# Patient Record
Sex: Female | Born: 1985 | Race: Black or African American | Hispanic: No | Marital: Single | Smoking: Former smoker
Health system: Southern US, Community
[De-identification: ages and names within clinical notes are randomized; demographics above are authoritative.]

## PROBLEM LIST (undated history)

## (undated) ENCOUNTER — Inpatient Hospital Stay (HOSPITAL_COMMUNITY): Payer: Self-pay

## (undated) DIAGNOSIS — L309 Dermatitis, unspecified: Secondary | ICD-10-CM

## (undated) DIAGNOSIS — G43909 Migraine, unspecified, not intractable, without status migrainosus: Secondary | ICD-10-CM

## (undated) HISTORY — PX: NO PAST SURGERIES: SHX2092

## (undated) HISTORY — DX: Dermatitis, unspecified: L30.9

---
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
## 2007-01-05 ENCOUNTER — Ambulatory Visit (HOSPITAL_COMMUNITY): Admission: RE | Admit: 2007-01-05 | Discharge: 2007-01-05 | Payer: Self-pay | Admitting: Obstetrics & Gynecology

## 2007-01-19 DIAGNOSIS — G43909 Migraine, unspecified, not intractable, without status migrainosus: Secondary | ICD-10-CM | POA: Insufficient documentation

## 2007-01-19 DIAGNOSIS — L2089 Other atopic dermatitis: Secondary | ICD-10-CM | POA: Insufficient documentation

## 2007-05-25 ENCOUNTER — Encounter (INDEPENDENT_AMBULATORY_CARE_PROVIDER_SITE_OTHER): Payer: Self-pay | Admitting: Obstetrics and Gynecology

## 2007-05-25 ENCOUNTER — Inpatient Hospital Stay (HOSPITAL_COMMUNITY): Admission: AD | Admit: 2007-05-25 | Discharge: 2007-05-27 | Payer: Self-pay | Admitting: Obstetrics and Gynecology

## 2007-10-15 ENCOUNTER — Emergency Department (HOSPITAL_COMMUNITY): Admission: EM | Admit: 2007-10-15 | Discharge: 2007-10-15 | Payer: Self-pay | Admitting: Family Medicine

## 2008-01-14 ENCOUNTER — Emergency Department (HOSPITAL_COMMUNITY): Admission: EM | Admit: 2008-01-14 | Discharge: 2008-01-14 | Payer: Self-pay | Admitting: Family Medicine

## 2008-08-06 ENCOUNTER — Emergency Department (HOSPITAL_COMMUNITY): Admission: EM | Admit: 2008-08-06 | Discharge: 2008-08-07 | Payer: Self-pay | Admitting: Emergency Medicine

## 2009-02-17 ENCOUNTER — Emergency Department (HOSPITAL_COMMUNITY): Admission: EM | Admit: 2009-02-17 | Discharge: 2009-02-17 | Payer: Self-pay | Admitting: Emergency Medicine

## 2009-06-10 ENCOUNTER — Emergency Department (HOSPITAL_COMMUNITY): Admission: EM | Admit: 2009-06-10 | Discharge: 2009-06-10 | Payer: Self-pay | Admitting: Emergency Medicine

## 2010-01-09 ENCOUNTER — Inpatient Hospital Stay (HOSPITAL_COMMUNITY): Admission: AD | Admit: 2010-01-09 | Discharge: 2010-01-11 | Payer: Self-pay | Admitting: Obstetrics and Gynecology

## 2010-04-18 IMAGING — CR DG CHEST 2V
2 series · 2 of 2 positions shown · non-contrast
Comparison: None

CLINICAL DATA: Fever and cough

CHEST - 2 VIEW

[w chest pa]
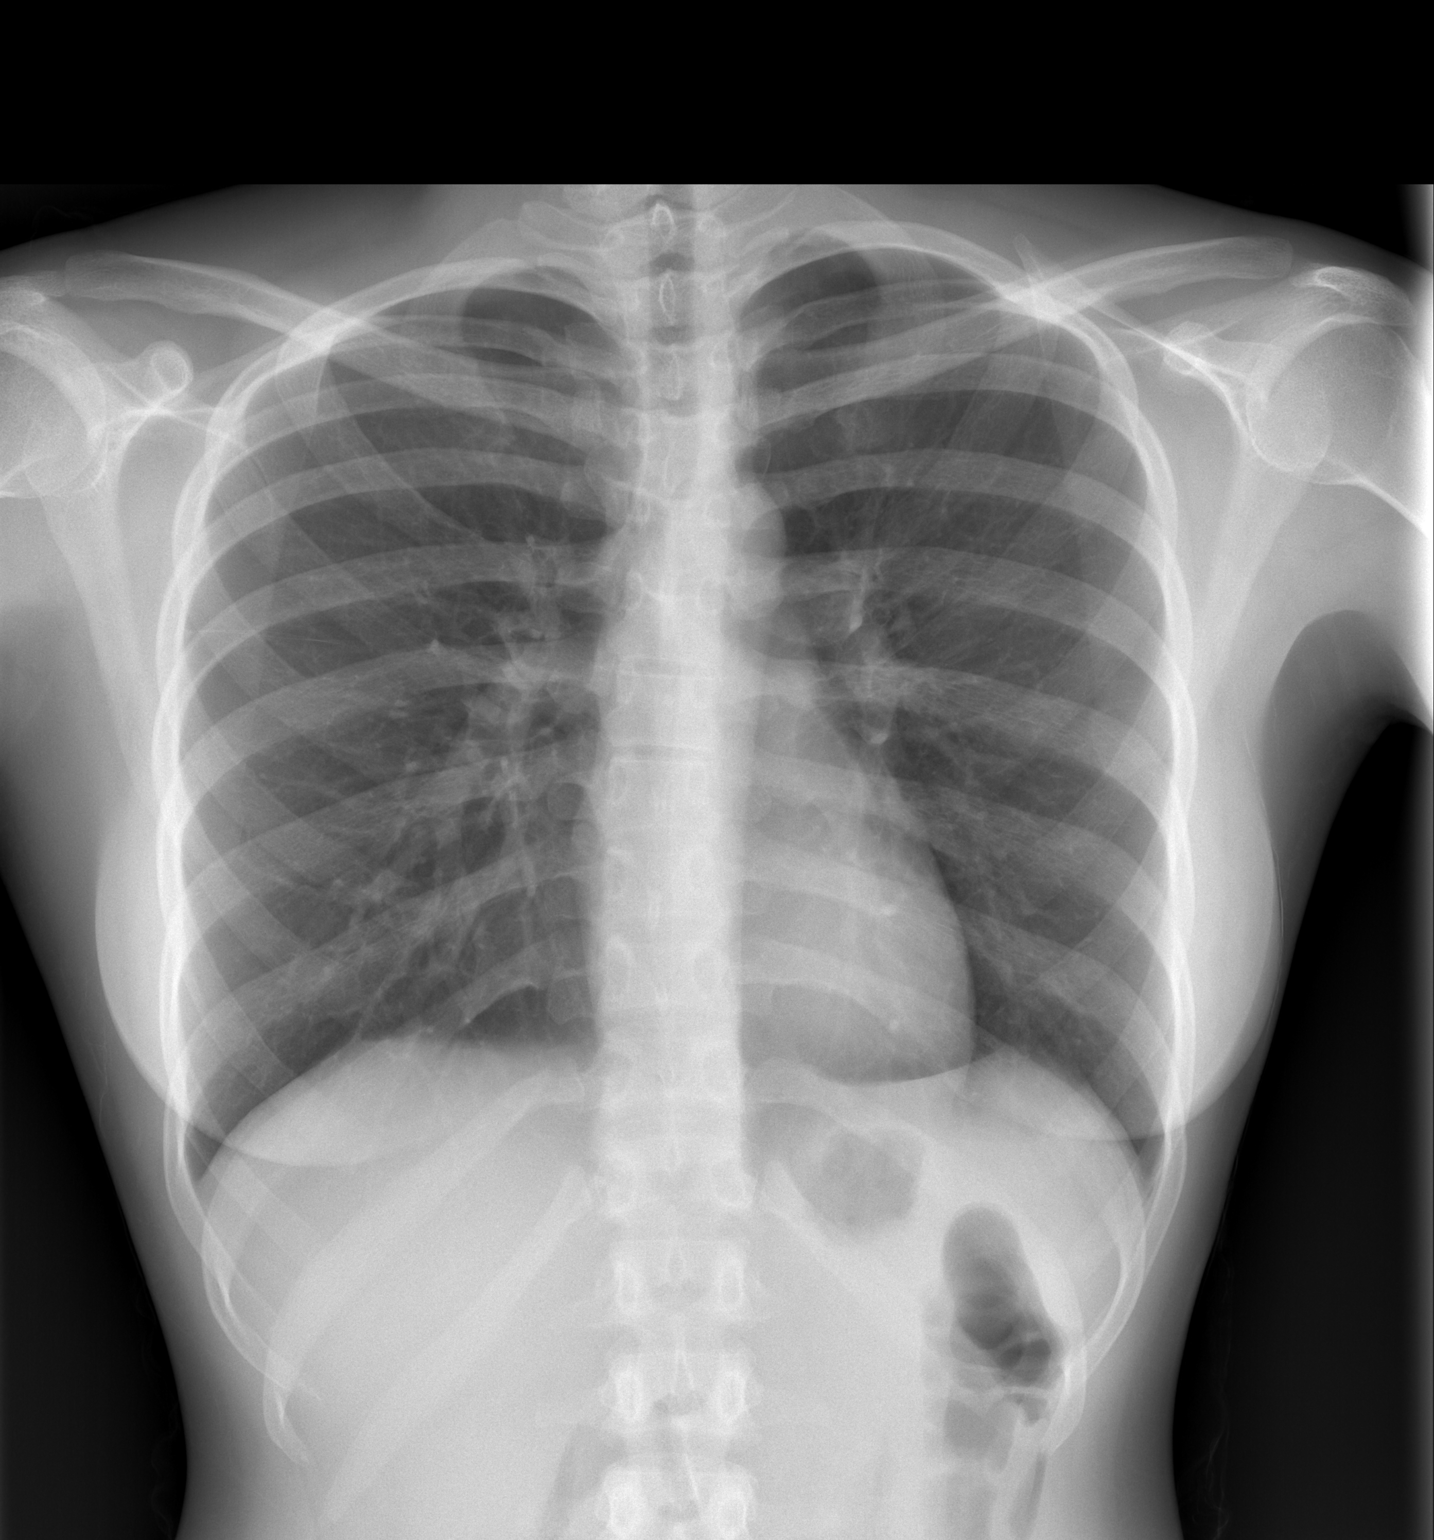

[w chest lat]
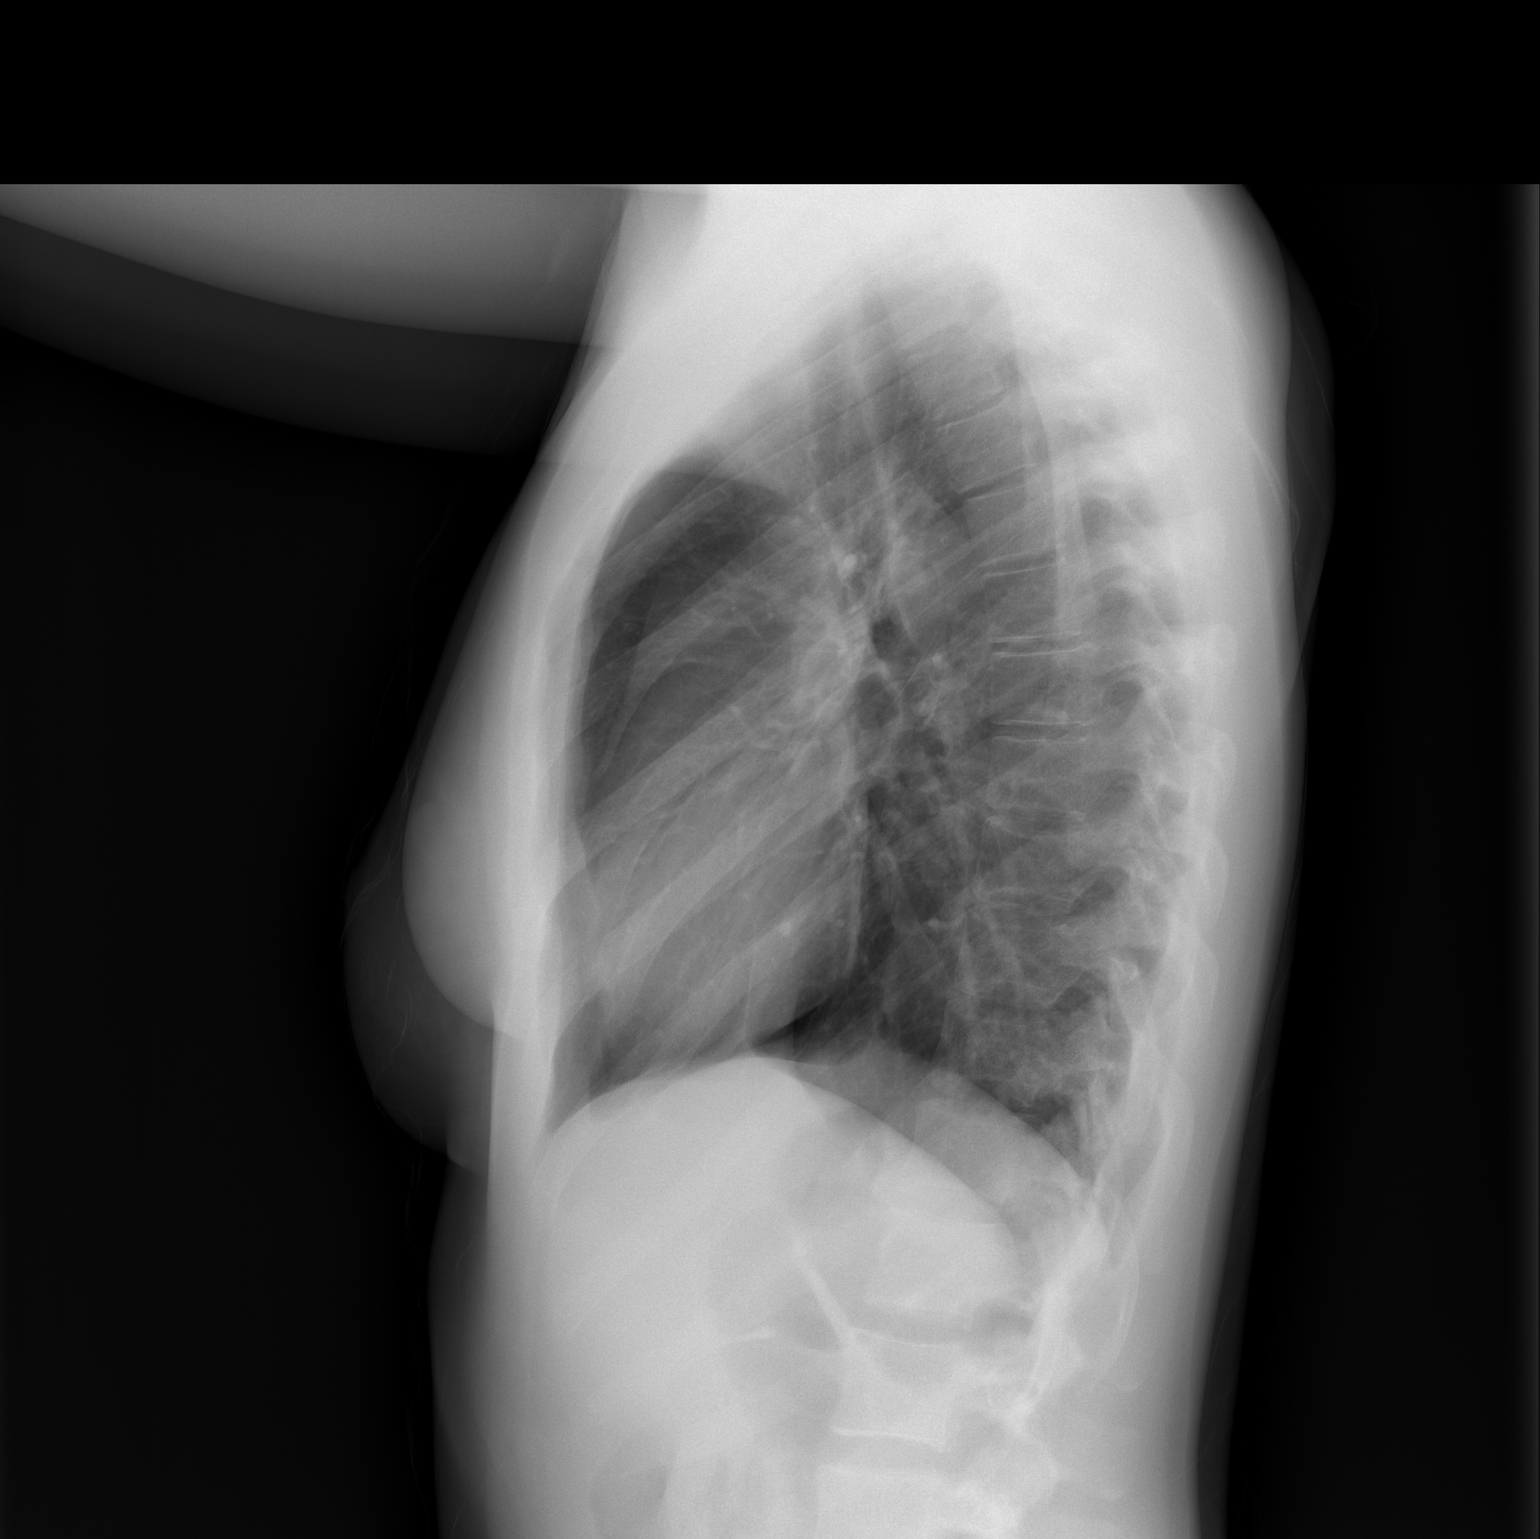

[2 of 2 positions shown; findings below may reference images not displayed]

FINDINGS: The heart size and mediastinal contours are within normal
limits.  Both lungs are clear.  The visualized skeletal structures
are unremarkable.
IMPRESSION: No acute cardiopulmonary abnormalities

## 2010-05-10 ENCOUNTER — Emergency Department (HOSPITAL_COMMUNITY): Admission: EM | Admit: 2010-05-10 | Discharge: 2010-05-10 | Payer: Self-pay | Admitting: Emergency Medicine

## 2010-06-09 ENCOUNTER — Emergency Department (HOSPITAL_COMMUNITY): Admission: EM | Admit: 2010-06-09 | Discharge: 2010-06-09 | Payer: Self-pay | Admitting: Emergency Medicine

## 2010-12-17 ENCOUNTER — Emergency Department (HOSPITAL_COMMUNITY)
Admission: EM | Admit: 2010-12-17 | Discharge: 2010-12-17 | Payer: Self-pay | Source: Home / Self Care | Admitting: Family Medicine

## 2010-12-17 LAB — POCT URINALYSIS DIPSTICK
Bilirubin Urine: NEGATIVE
Ketones, ur: NEGATIVE mg/dL
Nitrite: NEGATIVE
Protein, ur: NEGATIVE mg/dL

## 2010-12-17 LAB — POCT PREGNANCY, URINE: Preg Test, Ur: NEGATIVE

## 2010-12-17 LAB — WET PREP, GENITAL: Clue Cells Wet Prep HPF POC: NONE SEEN

## 2010-12-18 LAB — HSV 2 ANTIBODY, IGG: HSV 2 Glycoprotein G Ab, IgG: 0.51 IV

## 2010-12-18 LAB — GC/CHLAMYDIA PROBE AMP, GENITAL
Chlamydia, DNA Probe: POSITIVE — AB
GC Probe Amp, Genital: NEGATIVE

## 2011-01-22 ENCOUNTER — Emergency Department (HOSPITAL_COMMUNITY)
Admission: EM | Admit: 2011-01-22 | Discharge: 2011-01-22 | Disposition: A | Discharge status: Home / Self Care | Payer: Self-pay | Attending: Emergency Medicine | Admitting: Emergency Medicine

## 2011-01-22 DIAGNOSIS — R109 Unspecified abdominal pain: Secondary | ICD-10-CM | POA: Insufficient documentation

## 2011-01-22 DIAGNOSIS — N898 Other specified noninflammatory disorders of vagina: Secondary | ICD-10-CM | POA: Insufficient documentation

## 2011-01-22 LAB — URINALYSIS, ROUTINE W REFLEX MICROSCOPIC
Ketones, ur: NEGATIVE mg/dL
Nitrite: NEGATIVE
Protein, ur: NEGATIVE mg/dL
Urobilinogen, UA: 0.2 mg/dL (ref 0.0–1.0)
pH: 7 (ref 5.0–8.0)

## 2011-01-22 LAB — PREGNANCY, URINE: Preg Test, Ur: NEGATIVE

## 2011-01-23 LAB — GC/CHLAMYDIA PROBE AMP, GENITAL: Chlamydia, DNA Probe: NEGATIVE

## 2011-02-06 LAB — WET PREP, GENITAL
Trich, Wet Prep: NONE SEEN
Yeast Wet Prep HPF POC: NONE SEEN

## 2011-02-06 LAB — GC/CHLAMYDIA PROBE AMP, GENITAL
Chlamydia, DNA Probe: NEGATIVE
GC Probe Amp, Genital: NEGATIVE

## 2011-02-07 LAB — URINALYSIS, ROUTINE W REFLEX MICROSCOPIC
Nitrite: NEGATIVE
Protein, ur: NEGATIVE mg/dL
Specific Gravity, Urine: 1.028 (ref 1.005–1.030)
Urobilinogen, UA: 1 mg/dL (ref 0.0–1.0)

## 2011-02-07 LAB — WET PREP, GENITAL: Clue Cells Wet Prep HPF POC: NONE SEEN

## 2011-02-07 LAB — URINE MICROSCOPIC-ADD ON

## 2011-02-07 LAB — PREGNANCY, URINE: Preg Test, Ur: NEGATIVE

## 2011-02-07 LAB — URINE CULTURE
Colony Count: NO GROWTH
Culture: NO GROWTH

## 2011-02-10 LAB — CBC
Hemoglobin: 11.5 g/dL — ABNORMAL LOW (ref 12.0–15.0)
MCHC: 33.3 g/dL (ref 30.0–36.0)
MCV: 92.4 fL (ref 78.0–100.0)
Platelets: 179 10*3/uL (ref 150–400)
RBC: 3.7 MIL/uL — ABNORMAL LOW (ref 3.87–5.11)
RBC: 3.73 MIL/uL — ABNORMAL LOW (ref 3.87–5.11)
RDW: 13.4 % (ref 11.5–15.5)
WBC: 6.8 10*3/uL (ref 4.0–10.5)

## 2011-02-10 LAB — RPR: RPR Ser Ql: NONREACTIVE

## 2011-02-28 LAB — GC/CHLAMYDIA PROBE AMP, GENITAL: Chlamydia, DNA Probe: NEGATIVE

## 2011-03-04 LAB — RAPID STREP SCREEN (MED CTR MEBANE ONLY): Streptococcus, Group A Screen (Direct): POSITIVE — AB

## 2011-04-06 NOTE — Discharge Summary (Signed)
Sally Olson, Sally Olson              ACCOUNT NO.:  [id number]   MEDICAL RECORD NO.:  [id number]          PATIENT TYPE:  INP   LOCATION:  9140                          FACILITY:  [facility]   PHYSICIAN:  Sherron Monday, MD        DATE OF BIRTH:  1986/03/19   DATE OF ADMISSION:  05/25/2007  DATE OF DISCHARGE:  05/27/2007                               DISCHARGE SUMMARY   ADMITTING DIAGNOSIS:  Intrauterine pregnancy at term in labor.   DISCHARGE DIAGNOSIS:  Intrauterine pregnancy at term in labor, delivered  via spontaneous vaginal delivery.   HISTORY OF PRESENT ILLNESS:  A 25 year old G 1 P 0 at 38+ weeks via last  menstrual period consistent with 18-week ultrasound with an estimated  date of confinement of 06/03/2007 presents with regular contractions and  bloody show.  In evaluation in the MAU revealed her being 5 cm dilated  in regular contractions.  Her prenatal care:  She was transferred to our  group at approximately 26 weeks and in the her care had been treated  with Zithromax for Chlamydia with a negative test of cure.  Otherwise  uncomplicated prenatal care.   PAST MEDICAL HISTORY:  Not significant.   PAST SURGICAL HISTORY:  Not significant.   PAST OB-GYN HISTORY:  G1 is the present pregnancy.  History of  Chlamydia.  No abnormal Pap smears.   MEDICATIONS:  None.   ALLERGIES:  None.   SOCIAL HISTORY:  Denies alcohol, tobacco or drug use.   PHYSICAL EXAMINATION:  VITAL SIGNS:  On admission she is afebrile with  vital signs stable.  Fetal heart tone was reassuring with contractions  every 3-5 minutes.  ABDOMEN:  Gravida and nontender with an estimated fetal weight of 7  pounds.  VAGINAL EXAM:  After admission was 9, complete, and 1+ with a vertex and  an adequate pelvis.  AROM was for clear fluid.   PRENATAL LABS:  A positive, sickle cell screen negative, Hepatitis B  surface antigen negative, HIV negative,  RPR nonreactive, gonorrhea  negative, Chlamydia positive with a  negative test of cure.  Glucola 88.  Group B strep positive and rubella immune.   HOSPITAL COURSE:  She was admitted prior to her AROM she had epidural.  She is on penicillin for her group B Strep prophylaxis.  She progressed  to complete complete, +3, pushed for approximately 5 minutes with a  normal delivery of a viable female infant over intact perineum.  The  baby initially was crying and doing well, but then had an episode of  decreased heart rate and difficulty breathing and so NICU team was  called.  Apgars were 2 and 9 with a core pH of 7.28.  The placenta was  sent to pathology.  A small periurethral laceration was repaired with 3-  0 Vicryl.  Her postpartum course was relatively uncomplicated.  She  remained afebrile, stable vital signs throughout.  Hemoglobin decreased  from 10.2 to 9.4.  On postpartum day 2, the day of discharge, she had  normal lochia, her pain was well controlled, she was ambulating well.  The baby continued to be in the NICU receiving antibiotics, but was  otherwise doing well.  She was discharged to home on postpartum day #2  with routine discharge instructions and numbers to call with any  questions or problems.  She voiced understanding to this as well as she  was given prescriptions for Vicodin and Motrin and prenatal vitamins.  She will follow up in approximately 6 weeks.  The weight on the baby was  6 pounds 0 ounces.   DISCHARGE INFORMATION:  A positive, rubella immune.  She plans to breast  feed.  Her hemoglobin decreased from 10.2 to 9.4 and she plans to use an  IUD for contraception and will check her benefits for this.      Sherron Monday, MD  Electronically Signed     JB/MEDQ  D:  05/27/2007  T:  05/27/2007  Job:  [id number]

## 2011-06-16 ENCOUNTER — Inpatient Hospital Stay (INDEPENDENT_AMBULATORY_CARE_PROVIDER_SITE_OTHER)
Admission: RE | Admit: 2011-06-16 | Discharge: 2011-06-16 | Disposition: A | Discharge status: Home / Self Care | Payer: Self-pay | Source: Ambulatory Visit | Attending: Emergency Medicine | Admitting: Emergency Medicine

## 2011-06-16 DIAGNOSIS — N760 Acute vaginitis: Secondary | ICD-10-CM

## 2011-06-16 DIAGNOSIS — R300 Dysuria: Secondary | ICD-10-CM

## 2011-06-16 LAB — POCT PREGNANCY, URINE: Preg Test, Ur: NEGATIVE

## 2011-06-16 LAB — WET PREP, GENITAL: Trich, Wet Prep: NONE SEEN

## 2011-06-16 LAB — POCT URINALYSIS DIP (DEVICE)
Bilirubin Urine: NEGATIVE
Ketones, ur: NEGATIVE mg/dL
Nitrite: NEGATIVE
Protein, ur: NEGATIVE mg/dL

## 2011-06-18 LAB — URINE CULTURE
Colony Count: 25000
Culture  Setup Time: 201207252031

## 2011-08-13 LAB — POCT PREGNANCY, URINE: Operator id: 235561

## 2011-09-07 LAB — CBC
HCT: 28.9 — ABNORMAL LOW
HCT: 30.3 — ABNORMAL LOW
Hemoglobin: 9.4 — ABNORMAL LOW
MCHC: 32.5
MCHC: 33.7
MCV: 81.9
MCV: 83.3
Platelets: 281
RBC: 3.7 — ABNORMAL LOW
RDW: 13.7

## 2011-09-07 LAB — RPR: RPR Ser Ql: NONREACTIVE

## 2011-10-13 ENCOUNTER — Emergency Department (INDEPENDENT_AMBULATORY_CARE_PROVIDER_SITE_OTHER)
Admission: EM | Admit: 2011-10-13 | Discharge: 2011-10-13 | Disposition: A | Discharge status: Home / Self Care | Payer: Self-pay | Source: Home / Self Care | Attending: Family Medicine | Admitting: Family Medicine

## 2011-10-13 DIAGNOSIS — N760 Acute vaginitis: Secondary | ICD-10-CM

## 2011-10-13 LAB — POCT URINALYSIS DIP (DEVICE)
Ketones, ur: NEGATIVE mg/dL
Nitrite: NEGATIVE
Protein, ur: NEGATIVE mg/dL
Urobilinogen, UA: 0.2 mg/dL (ref 0.0–1.0)
pH: 7 (ref 5.0–8.0)

## 2011-10-13 LAB — WET PREP, GENITAL

## 2011-10-13 LAB — POCT PREGNANCY, URINE: Preg Test, Ur: NEGATIVE

## 2011-10-13 MED ORDER — METRONIDAZOLE 500 MG PO TABS: 500.0000 mg | ORAL_TABLET | Freq: Two times a day (BID) | ORAL | Status: AC

## 2011-10-13 NOTE — ED Notes (Signed)
C/o vaginal discharge 3 weeks ago, states was treated with flagyl x 7 days.  After that she got a yeast infection which she treated with one day monistat last week.  Continues to have vaginal discharge with odor.  Denies itching or pain.

## 2011-10-13 NOTE — ED Provider Notes (Signed)
History     CSN: [id number] Arrival date & time: 10/13/2011  1:17 PM   First MD Initiated Contact with Patient 10/13/11 1258      Chief Complaint  Patient presents with  . Vaginal Discharge    (Consider location/radiation/quality/duration/timing/severity/associated sxs/prior treatment) Patient is a 25 y.o. female presenting with vaginal discharge.  Vaginal Discharge This is a new problem. The current episode started more than 1 week ago. The problem occurs constantly. The problem has not changed since onset.The symptoms are aggravated by nothing. The symptoms are relieved by nothing.  states she was treated with 7 days of flagyl. Continues to have discharge with odor. Used 1 day monistates last week without relief. Denies uti symptoms.   History reviewed. No pertinent past medical history.  History reviewed. No pertinent past surgical history.  No family history on file.  History  Substance Use Topics  . Smoking status: Never Smoker   . Smokeless tobacco: Not on file  . Alcohol Use: No    OB History    Grav Para Term Preterm Abortions TAB SAB Ect Mult Living                  Review of Systems  Constitutional: Negative.   HENT: Negative.   Cardiovascular: Negative.   Gastrointestinal: Negative.   Genitourinary: Positive for vaginal discharge. Negative for dysuria, urgency, hematuria, flank pain, genital sores and pelvic pain.  Musculoskeletal: Negative.   Skin: Negative.     Allergies  Review of patient's allergies indicates no known allergies.  Home Medications   Current Outpatient Rx  Name Route Sig Dispense Refill  . METRONIDAZOLE 500 MG PO TABS Oral Take 1 tablet (500 mg total) by mouth 2 (two) times daily. 14 tablet 0    BP 108/63  Pulse 70  Temp(Src) 98.9 F (37.2 C) (Oral)  Resp 16  SpO2 100%  LMP 09/07/2011  Physical Exam  Nursing note and vitals reviewed. Constitutional: She appears well-developed and well-nourished. No distress.    Cardiovascular: Regular rhythm.   Pulmonary/Chest: Effort normal and breath sounds normal.  Abdominal: Soft. Bowel sounds are normal. There is no tenderness.  Genitourinary:       Pelvic exam with female nursing personal Chip Boer assisting reveals no skin or vulvar lesions. Very little thin white discharge. Samples collected. No cmt.     ED Course  Procedures (including critical care time)  Labs Reviewed  POCT URINALYSIS DIP (DEVICE) - Abnormal; Notable for the following:    Leukocytes, UA SMALL (*) Biochemical Testing Only. Please order routine urinalysis from main lab if confirmatory testing is needed.   All other components within normal limits  POCT PREGNANCY, URINE  POCT URINALYSIS DIPSTICK  POCT PREGNANCY, URINE  GC/CHLAMYDIA PROBE AMP, GENITAL  WET PREP, GENITAL   No results found.   1. Vaginitis       MDM          Randa Spike, MD 10/13/11 1410

## 2011-10-15 NOTE — ED Notes (Signed)
Wet prep showed mod. Clue cells.  Pt adequately treated with Flagyl.  GC and chlamydia negative.

## 2011-11-05 ENCOUNTER — Emergency Department (INDEPENDENT_AMBULATORY_CARE_PROVIDER_SITE_OTHER)
Admission: EM | Admit: 2011-11-05 | Discharge: 2011-11-05 | Disposition: A | Discharge status: Home / Self Care | Payer: Self-pay | Source: Home / Self Care | Attending: Emergency Medicine | Admitting: Emergency Medicine

## 2011-11-05 ENCOUNTER — Encounter (HOSPITAL_COMMUNITY): Payer: Self-pay | Admitting: Emergency Medicine

## 2011-11-05 DIAGNOSIS — J111 Influenza due to unidentified influenza virus with other respiratory manifestations: Secondary | ICD-10-CM

## 2011-11-05 HISTORY — DX: Migraine, unspecified, not intractable, without status migrainosus: G43.909

## 2011-11-05 MED ORDER — HYDROCODONE-ACETAMINOPHEN 5-325 MG PO TABS: 2.0000 | ORAL_TABLET | Freq: Four times a day (QID) | ORAL | Status: AC | PRN

## 2011-11-05 MED ORDER — FLUTICASONE PROPIONATE 50 MCG/ACT NA SUSP: 2.0000 | Freq: Every day | NASAL | Status: DC

## 2011-11-05 MED ORDER — IBUPROFEN 600 MG PO TABS: 600.0000 mg | ORAL_TABLET | Freq: Four times a day (QID) | ORAL | Status: AC | PRN

## 2011-11-05 MED ORDER — PSEUDOEPHEDRINE-GUAIFENESIN ER 120-1200 MG PO TB12: 1.0000 | ORAL_TABLET | Freq: Two times a day (BID) | ORAL | Status: DC | PRN

## 2011-11-05 NOTE — ED Notes (Signed)
Cough, achyness, fever, runny nose, migraines for 2 days, has gotten worse. Last night was worse.

## 2011-11-05 NOTE — ED Provider Notes (Signed)
History     CSN: [id number] Arrival date & time: 11/05/2011  3:45 PM   First MD Initiated Contact with Patient 11/05/11 1523      No chief complaint on file.   (Consider location/radiation/quality/duration/timing/severity/associated sxs/prior treatment) HPI Comments: HPI : Flu symptoms for about 2 day. Fever to 103 with chills, sweats, myalgias, fatigue, headache, rhinorrhea, nonproductive cough. Unable to sleep at night secondary to coughing. Symptoms are progressively worsening, despite trying OTC fever reducing medicine and rest and fluids. Has decreased appetite, but tolerating some liquids by mouth. Did not get flu shot this year.  Review of Systems: Positive for fatigue, mild nasal congestion, mild sore throat, mild swollen anterior neck glands, mild cough. Negative for acute vision changes, stiff neck, focal weakness, syncope, seizures, respiratory distress, vomiting, diarrhea, GU symptoms.   The history is provided by the patient.    Past Medical History  Diagnosis Date  . Migraines     History reviewed. No pertinent past surgical history.  History reviewed. No pertinent family history.  History  Substance Use Topics  . Smoking status: Never Smoker   . Smokeless tobacco: Not on file  . Alcohol Use: No    OB History    Grav Para Term Preterm Abortions TAB SAB Ect Mult Living                  Review of Systems  Allergies  Review of patient's allergies indicates no known allergies.  Home Medications   Current Outpatient Rx  Name Route Sig Dispense Refill  . PSEUDOEPH-DOXYLAMINE-DM-APAP 60-7.04-20-999 MG/30ML PO LIQD Oral Take by mouth.      Marland Kitchen FLUTICASONE PROPIONATE 50 MCG/ACT NA SUSP Nasal Place 2 sprays into the nose daily. 16 g 0  . HYDROCODONE-ACETAMINOPHEN 5-325 MG PO TABS Oral Take 2 tablets by mouth every 6 (six) hours as needed for pain. 20 tablet 0  . IBUPROFEN 600 MG PO TABS Oral Take 1 tablet (600 mg total) by mouth every 6 (six) hours as  needed for pain. 30 tablet 0  . PSEUDOEPHEDRINE-GUAIFENESIN [phone] MG PO TB12 Oral Take 1 tablet by mouth 2 (two) times daily as needed (congestion). 20 each 0    BP 127/80  Pulse 117  Temp(Src) 102 F (38.9 C) (Oral)  Resp 20  SpO2 100%  LMP 10/08/2011  Physical Exam  Nursing note and vitals reviewed. Constitutional: She is oriented to person, place, and time. She appears well-developed and well-nourished.  HENT:  Head: Normocephalic and atraumatic.  Right Ear: Tympanic membrane and ear canal normal.  Left Ear: Tympanic membrane and ear canal normal.  Nose: Mucosal edema and rhinorrhea present. No epistaxis.  Mouth/Throat: Uvula is midline and mucous membranes are normal. Posterior oropharyngeal erythema present. No oropharyngeal exudate.       (-) frontal, maxillary sinus tenderness  Eyes: Conjunctivae and EOM are normal. Pupils are equal, round, and reactive to light.  Neck: Normal range of motion. Neck supple.       Shotty LN bilaterally  Cardiovascular: Normal rate, regular rhythm and normal heart sounds.   Pulmonary/Chest: Effort normal and breath sounds normal. No respiratory distress. She has no wheezes. She has no rales.  Abdominal: Soft. Bowel sounds are normal. She exhibits no distension. There is no tenderness. There is no rebound and no guarding.  Musculoskeletal: Normal range of motion.  Lymphadenopathy:    She has cervical adenopathy.  Neurological: She is alert and oriented to person, place, and time.  Skin: Skin is warm and  dry. No rash noted.  Psychiatric: She has a normal mood and affect. Her behavior is normal. Judgment and thought content normal.    ED Course  Procedures (including critical care time)  Labs Reviewed - No data to display No results found.   1. Influenza       MDM  Pt appears to be in NAD. VSS. Pt non-toxic appearing. No evidence of pharyngitis or OM. No evidence of neck stiffness or other sx to support meningitis. No evidence of  dehydration. Abd S/NT/ND without peritoneal sx. Doubt intraabdominal process. No evidence of PNA or UTI. Pt able to tolerate PO. Pt with viral syndrome, probable flu. Will treat symptomatically and have pt f/u with PCP of choice PRN   Luiz Blare, MD 11/05/11 1635

## 2014-04-10 ENCOUNTER — Emergency Department (HOSPITAL_COMMUNITY)
Admission: EM | Admit: 2014-04-10 | Discharge: 2014-04-10 | Disposition: A | Discharge status: Home / Self Care | Payer: BC Managed Care – PPO | Source: Home / Self Care | Attending: Family Medicine | Admitting: Family Medicine

## 2014-04-10 ENCOUNTER — Encounter (HOSPITAL_COMMUNITY): Payer: Self-pay | Admitting: Emergency Medicine

## 2014-04-10 DIAGNOSIS — J020 Streptococcal pharyngitis: Secondary | ICD-10-CM

## 2014-04-10 LAB — POCT RAPID STREP A: Streptococcus, Group A Screen (Direct): POSITIVE — AB

## 2014-04-10 MED ORDER — AMOXICILLIN 500 MG PO CAPS: 500.0000 mg | ORAL_CAPSULE | Freq: Three times a day (TID) | ORAL | Status: DC

## 2014-04-10 NOTE — Discharge Instructions (Signed)
Thank you for coming in today. Take amoxicillin three times daily for 10 days.  Use aleve 2 pills twice a day as needed for pain.  Call or go to the emergency room if you get worse, have trouble breathing, have chest pains, or palpitations.   Strep Throat Strep throat is an infection of the throat caused by a bacteria named Streptococcus pyogenes. Your caregiver may call the infection streptococcal "tonsillitis" or "pharyngitis" depending on whether there are signs of inflammation in the tonsils or back of the throat. Strep throat is most common in children aged 5 15 years during the cold months of the year, but it can occur in people of any age during any season. This infection is spread from person to person (contagious) through coughing, sneezing, or other close contact. SYMPTOMS   Fever or chills.  Painful, swollen, red tonsils or throat.  Pain or difficulty when swallowing.  White or yellow spots on the tonsils or throat.  Swollen, tender lymph nodes or "glands" of the neck or under the jaw.  Red rash all over the body (rare). DIAGNOSIS  Many different infections can cause the same symptoms. A test must be done to confirm the diagnosis so the right treatment can be given. A "rapid strep test" can help your caregiver make the diagnosis in a few minutes. If this test is not available, a light swab of the infected area can be used for a throat culture test. If a throat culture test is done, results are usually available in a day or two. TREATMENT  Strep throat is treated with antibiotic medicine. HOME CARE INSTRUCTIONS   Gargle with 1 tsp of salt in 1 cup of warm water, 3 4 times per day or as needed for comfort.  Family members who also have a sore throat or fever should be tested for strep throat and treated with antibiotics if they have the strep infection.  Make sure everyone in your household washes their hands well.  Do not share food, drinking cups, or personal items that  could cause the infection to spread to others.  You may need to eat a soft food diet until your sore throat gets better.  Drink enough water and fluids to keep your urine clear or pale yellow. This will help prevent dehydration.  Get plenty of rest.  Stay home from school, daycare, or work until you have been on antibiotics for 24 hours.  Only take over-the-counter or prescription medicines for pain, discomfort, or fever as directed by your caregiver.  If antibiotics are prescribed, take them as directed. Finish them even if you start to feel better. SEEK MEDICAL CARE IF:   The glands in your neck continue to enlarge.  You develop a rash, cough, or earache.  You cough up green, yellow-brown, or bloody sputum.  You have pain or discomfort not controlled by medicines.  Your problems seem to be getting worse rather than better. SEEK IMMEDIATE MEDICAL CARE IF:   You develop any new symptoms such as vomiting, severe headache, stiff or painful neck, chest pain, shortness of breath, or trouble swallowing.  You develop severe throat pain, drooling, or changes in your voice.  You develop swelling of the neck, or the skin on the neck becomes red and tender.  You have a fever.  You develop signs of dehydration, such as fatigue, dry mouth, and decreased urination.  You become increasingly sleepy, or you cannot wake up completely. Document Released: 11/05/2000 Document Revised: 10/25/2012 Document Reviewed:  01/07/2011 ExitCare Patient Information 2014 [employer].

## 2014-04-10 NOTE — ED Provider Notes (Signed)
Neville RouteJasmine S Olson is a 28 y.o. female who presents to Urgent Care today for sore throat. Patient is sore throat for 2 days associated with worse voice. No vomiting or diarrhea. Patient has had subjective fever. She denies any cough congestion or runny nose. Additionally she notes mild right ear pain. She's tried TheraFlu and Tylenol does not help. She feels well otherwise.   Past Medical History  Diagnosis Date  . Migraines    History  Substance Use Topics  . Smoking status: Current Every Day Smoker -- 0.50 packs/day    Types: Cigarettes  . Smokeless tobacco: Not on file  . Alcohol Use: Yes   ROS as above Medications: No current facility-administered medications for this encounter.   Current Outpatient Prescriptions  Medication Sig Dispense Refill  . amoxicillin (AMOXIL) 500 MG capsule Take 1 capsule (500 mg total) by mouth 3 (three) times daily.  30 capsule  0  . [DISCONTINUED] fluticasone (FLONASE) 50 MCG/ACT nasal spray Place 2 sprays into the nose daily.  16 g  0    Exam:  BP 138/80  Pulse 85  Temp(Src) 99.1 F (37.3 C) (Oral)  Resp 12  SpO2 100%  LMP 04/07/2014 Gen: Well NAD HEENT: EOMI,  MMM posterior pharynx is very erythematous. Right tympanic membranes normal. Left is occluded by cerumen. Lungs: Normal work of breathing. CTABL Heart: RRR no MRG Abd: NABS, Soft. NT, ND Exts: Brisk capillary refill, warm and well perfused.   Results for orders placed during the hospital encounter of 04/10/14 (from the past 24 hour(s))  POCT RAPID STREP A ([facility]RG CARE ONLY)     Status: Abnormal   Collection Time    04/10/14  3:15 PM      Result Value Ref Range   Streptococcus, Group A Screen (Direct) POSITIVE (*) NEGATIVE   No results found.  Assessment and Plan: 28 y.o. female with strep throat. Plan to treat with amoxicillin. Patient declined cerumen removal in the office today.  Discussed warning signs or symptoms. Please see discharge instructions. Patient expresses  understanding.    Rodolph BongEvan S Ani Deoliveira, MD 04/10/14 [phone]

## 2014-04-10 NOTE — ED Notes (Signed)
C/o  Sore throat.  Loss of voice.  Pain with swallowing.  Muffled sound in ears.  Low grade temp.   Since Monday.   No relief with otc meds.

## 2014-05-01 ENCOUNTER — Other Ambulatory Visit (HOSPITAL_COMMUNITY)
Admission: RE | Admit: 2014-05-01 | Discharge: 2014-05-01 | Disposition: A | Discharge status: Home / Self Care | Payer: BC Managed Care – PPO | Source: Ambulatory Visit | Attending: Family Medicine | Admitting: Family Medicine

## 2014-05-01 ENCOUNTER — Other Ambulatory Visit: Payer: Self-pay | Admitting: Family Medicine

## 2014-05-01 DIAGNOSIS — Z124 Encounter for screening for malignant neoplasm of cervix: Secondary | ICD-10-CM | POA: Insufficient documentation

## 2014-05-01 DIAGNOSIS — Z113 Encounter for screening for infections with a predominantly sexual mode of transmission: Secondary | ICD-10-CM | POA: Insufficient documentation

## 2014-05-06 LAB — CYTOLOGY - PAP

## 2014-11-22 NOTE — L&D Delivery Note (Addendum)
Delivery Note At 5:29 PM a viable female, Sally Olson or "[name]",  was delivered via Vaginal, Spontaneous Delivery (Presentation: ; Occiput Anterior).  APGAR: 8, 9; weight  .   Placenta status: Intact, Spontaneous.  Cord: 3 vessels with the following complications: None.  Cord pH: NA  Anesthesia: Epidural,  Local for repair Episiotomy: None Lacerations:  Peri- clitoral, with catheter inserted for urethral localization during repair. Suture Repair: 3.0 vicryl Est. Blood Loss (mL): 394  Mom to postpartum.  Baby to Couplet care / Skin to Skin  Family desires circumcision inpatient (putting baby on private insurance).  Sally Olson 07/13/2015, 6:26 PM

## 2014-11-23 ENCOUNTER — Inpatient Hospital Stay (HOSPITAL_COMMUNITY)
Admission: AD | Admit: 2014-11-23 | Discharge: 2014-11-23 | Disposition: A | Discharge status: Home / Self Care | Payer: BC Managed Care – PPO | Source: Ambulatory Visit | Attending: Obstetrics and Gynecology | Admitting: Obstetrics and Gynecology

## 2014-11-23 ENCOUNTER — Inpatient Hospital Stay (HOSPITAL_COMMUNITY): Payer: BC Managed Care – PPO

## 2014-11-23 ENCOUNTER — Encounter (HOSPITAL_COMMUNITY): Payer: Self-pay | Admitting: *Deleted

## 2014-11-23 DIAGNOSIS — Z87891 Personal history of nicotine dependence: Secondary | ICD-10-CM | POA: Diagnosis not present

## 2014-11-23 DIAGNOSIS — R109 Unspecified abdominal pain: Secondary | ICD-10-CM | POA: Diagnosis present

## 2014-11-23 DIAGNOSIS — R52 Pain, unspecified: Secondary | ICD-10-CM

## 2014-11-23 DIAGNOSIS — O9989 Other specified diseases and conditions complicating pregnancy, childbirth and the puerperium: Secondary | ICD-10-CM | POA: Diagnosis not present

## 2014-11-23 DIAGNOSIS — Z3A01 Less than 8 weeks gestation of pregnancy: Secondary | ICD-10-CM | POA: Diagnosis not present

## 2014-11-23 DIAGNOSIS — O210 Mild hyperemesis gravidarum: Secondary | ICD-10-CM | POA: Diagnosis not present

## 2014-11-23 DIAGNOSIS — O219 Vomiting of pregnancy, unspecified: Secondary | ICD-10-CM

## 2014-11-23 DIAGNOSIS — Z3A09 9 weeks gestation of pregnancy: Secondary | ICD-10-CM

## 2014-11-23 DIAGNOSIS — O2341 Unspecified infection of urinary tract in pregnancy, first trimester: Secondary | ICD-10-CM | POA: Diagnosis not present

## 2014-11-23 LAB — CBC
HCT: 39.4 % (ref 36.0–46.0)
Hemoglobin: 13 g/dL (ref 12.0–15.0)
MCH: 28.7 pg (ref 26.0–34.0)
MCHC: 33 g/dL (ref 30.0–36.0)
MCV: 87 fL (ref 78.0–100.0)
PLATELETS: 249 10*3/uL (ref 150–400)
RBC: 4.53 MIL/uL (ref 3.87–5.11)
RDW: 13.7 % (ref 11.5–15.5)
WBC: 6.9 10*3/uL (ref 4.0–10.5)

## 2014-11-23 LAB — URINALYSIS, ROUTINE W REFLEX MICROSCOPIC
Bilirubin Urine: NEGATIVE
GLUCOSE, UA: NEGATIVE mg/dL
Hgb urine dipstick: NEGATIVE
NITRITE: NEGATIVE
Protein, ur: NEGATIVE mg/dL
Specific Gravity, Urine: >1.03 — ABNORMAL HIGH (ref 1.005–1.030)
Urobilinogen, UA: 0.2 mg/dL (ref 0.0–1.0)
pH: 6 (ref 5.0–8.0)

## 2014-11-23 LAB — WET PREP, GENITAL
TRICH WET PREP: NONE SEEN
YEAST WET PREP: NONE SEEN

## 2014-11-23 LAB — URINE MICROSCOPIC-ADD ON

## 2014-11-23 LAB — POCT PREGNANCY, URINE: Preg Test, Ur: POSITIVE — AB

## 2014-11-23 LAB — HCG, QUANTITATIVE, PREGNANCY: HCG, BETA CHAIN, QUANT, S: 55124 m[IU]/mL — AB (ref <5)

## 2014-11-23 MED ORDER — CEPHALEXIN 500 MG PO CAPS: 500.0000 mg | ORAL_CAPSULE | Freq: Four times a day (QID) | ORAL | Status: DC

## 2014-11-23 MED ORDER — METOCLOPRAMIDE HCL 10 MG PO TABS
10.0000 mg | ORAL_TABLET | Freq: Once | ORAL | Status: AC
  Administered 2014-11-23: 10 mg via ORAL
  Filled 2014-11-23: qty 1

## 2014-11-23 MED ORDER — LACTATED RINGERS IV SOLN
Freq: Once | INTRAVENOUS | Status: AC
  Administered 2014-11-23: 20:00:00 via INTRAVENOUS
  Filled 2014-11-23: qty 1000

## 2014-11-23 MED ORDER — ACETAMINOPHEN 325 MG PO TABS
650.0000 mg | ORAL_TABLET | Freq: Once | ORAL | Status: AC
  Administered 2014-11-23: 650 mg via ORAL
  Filled 2014-11-23: qty 2

## 2014-11-23 MED ORDER — METOCLOPRAMIDE HCL 10 MG PO TABS: 10.0000 mg | ORAL_TABLET | Freq: Four times a day (QID) | ORAL | Status: DC | PRN

## 2014-11-23 NOTE — MAU Note (Signed)
Pt presents complaining of abdominal cramping and vomiting since Thursday. Denies vaginal bleeding or discharge. +HPT

## 2014-11-23 NOTE — Discharge Instructions (Signed)
Prenatal Care [facility] OB/GYN    [facility] OB/GYN  & Infertility  [phone]     Phone: [phone]          [facility]                      [facility]   [place]     Phone: [phone]  Phone: [phone]         [facility]     Phone: [phone]  Phone: [phone]           [facility] [place]                hone: [phone]  Phone: [phone]         [facility] Dr. Francoise Ceo      Phone: [phone]  Phone: [phone]         [facility] Dept[facility]                Phone: [phone]  [facility]   [phone]    Family [address] [place])          Phone: [phone] [facility]   Phone: [phone]  Morning Sickness Morning sickness is when you feel sick to your stomach (nauseous) during pregnancy. This nauseous feeling may or may not come with vomiting. It often occurs in the morning but can be a problem any time of day. Morning sickness is most common during the first trimester, but it may continue throughout pregnancy. While morning sickness is unpleasant, it is usually harmless unless you develop severe and continual vomiting (hyperemesis gravidarum). This condition requires more intense treatment.  CAUSES  The cause of morning sickness is not completely known but seems to be related to normal hormonal changes that occur in pregnancy. RISK FACTORS You are at greater risk if you:  Experienced nausea or vomiting before your pregnancy.  Had morning sickness during a previous pregnancy.  Are pregnant with more than one baby, such as twins. TREATMENT  Do not use any medicines (prescription, over-the-counter, or herbal) for morning sickness without first talking to your health care provider. Your health care provider may prescribe or recommend:  Vitamin B6  supplements.  Anti-nausea medicines.  The herbal medicine ginger. HOME CARE INSTRUCTIONS   Only take over-the-counter or prescription medicines as directed by your health care provider.  Taking multivitamins before getting pregnant can prevent or decrease the severity of morning sickness in most women.  Eat a piece of dry toast or unsalted crackers before getting out of bed in the morning.  Eat five or six small meals a day.  Eat dry and bland foods (rice, baked potato). Foods high in carbohydrates are often helpful.  Do not drink liquids with your meals. Drink liquids between meals.  Avoid greasy, fatty, and spicy foods.  Get someone to cook for you if the smell of any food causes nausea and vomiting.  If you feel nauseous after taking prenatal vitamins, take the vitamins at night or with a snack.  Snack on protein foods (nuts, yogurt, cheese) between meals if you are hungry.  Eat unsweetened gelatins for desserts.  Wearing an acupressure wristband (worn for sea sickness) may be helpful.  Acupuncture may be helpful.  Do not smoke.  Get a humidifier to keep the air in your house free of odors.  Get  plenty of fresh air. SEEK MEDICAL CARE IF:   Your home remedies are not working, and you need medicine.  You feel dizzy or lightheaded.  You are losing weight. SEEK IMMEDIATE MEDICAL CARE IF:   You have persistent and uncontrolled nausea and vomiting.  You pass out (faint). MAKE SURE YOU:  Understand these instructions.  Will watch your condition.  Will get help right away if you are not doing well or get worse. Document Released: 12/30/2006 Document Revised: 11/13/2013 Document Reviewed: 04/25/2013 [facility] Patient Information 2015 [place], [place]. This information is not intended to replace advice given to you by your health care provider. Make sure you discuss any questions you have with your health care provider.  Eating Plan for Hyperemesis  Gravidarum Severe cases of hyperemesis gravidarum can lead to dehydration and malnutrition. The hyperemesis eating plan is one way to lessen the symptoms of nausea and vomiting. It is often used with prescribed medicines to control your symptoms.  WHAT CAN I DO TO RELIEVE MY SYMPTOMS? Listen to your body. Everyone is different and has different preferences. Find what works best for you. Some of the following things may help:  Eat and drink slowly.  Eat 5-6 small meals daily instead of 3 large meals.   Eat crackers before you get out of bed in the morning.   Starchy foods are usually well tolerated (such as cereal, toast, bread, potatoes, pasta, rice, and pretzels).   Ginger may help with nausea. Add  tsp ground ginger to hot tea or choose ginger tea.   Try drinking 100% fruit juice or an electrolyte drink.  Continue to take your prenatal vitamins as directed by your health care provider. If you are having trouble taking your prenatal vitamins, talk with your health care provider about different options.  Include at least 1 serving of protein with your meals and snacks (such as meats or poultry, beans, nuts, eggs, or yogurt). Try eating a protein-rich snack before bed (such as cheese and crackers or a half [place] or peanut butter sandwich). WHAT THINGS SHOULD I AVOID TO REDUCE MY SYMPTOMS? The following things may help reduce your symptoms:  Avoid foods with strong smells. Try eating meals in well-ventilated areas that are free of odors.  Avoid drinking water or other beverages with meals. Try not to drink anything less than 30 minutes before and after meals.  Avoid drinking more than 1 cup of fluid at a time.  Avoid fried or high-fat foods, such as butter and cream sauces.  Avoid spicy foods.  Avoid skipping meals the best you can. Nausea can be more intense on an empty stomach. If you cannot tolerate food at that time, do not force it. Try sucking on ice chips or other frozen  items and make up the calories later.  Avoid lying down within 2 hours after eating. Document Released: 09/05/2007 Document Revised: 11/13/2013 Document Reviewed: 09/12/2013 [facility] Patient Information 2015 [place], [place]. This information is not intended to replace advice given to you by your health care provider. Make sure you discuss any questions you have with your health care provider.

## 2014-11-23 NOTE — MAU Provider Note (Signed)
History     CSN: [id number]  Arrival date and time: 11/23/14 1700   First Provider Initiated Contact with Patient 11/23/14 1738      Chief Complaint  Patient presents with  . Emesis  . Abdominal Pain   HPI Comments: Sally Olson 29 y.o. [id number] [redacted]w[redacted]d presents to MAU with abdominal pain and vomiting since Thursday. She denies any vaginal bleeding. Her pain is 7/10 and she took tylenol but it made her nauseated. She has not scheduled prenatal care. She has some urinary frequency but no other sx of UTI.  Emesis  Associated symptoms include abdominal pain.  Abdominal Pain Associated symptoms include vomiting.      Past Medical History  Diagnosis Date  . Migraines     Past Surgical History  Procedure Laterality Date  . No past surgeries      History reviewed. No pertinent family history.  History  Substance Use Topics  . Smoking status: Former Smoker -- 0.50 packs/day    Types: Cigarettes    Quit date: 09/23/2014  . Smokeless tobacco: Not on file  . Alcohol Use: No    Allergies: No Known Allergies  Prescriptions prior to admission  Medication Sig Dispense Refill Last Dose  . acetaminophen (TYLENOL) 650 MG CR tablet Take 650 mg by mouth every 8 (eight) hours as needed (for headache).   Past Week at Unknown time  . Prenatal Vit-Fe Fumarate-FA (PRENATAL MULTIVITAMIN) TABS tablet Take 1 tablet by mouth daily.   11/23/2014 at Unknown time  . amoxicillin (AMOXIL) 500 MG capsule Take 1 capsule (500 mg total) by mouth 3 (three) times daily. (Patient not taking: Reported on 11/23/2014) 30 capsule 0     Review of Systems  Constitutional: Negative.   HENT: Negative.   Eyes: Negative.   Respiratory: Negative.   Cardiovascular: Negative.   Gastrointestinal: Positive for vomiting and abdominal pain.  Genitourinary: Negative.   Musculoskeletal: Negative.   Skin: Negative.   Neurological: Negative.   Psychiatric/Behavioral: Negative.    Physical Exam   Blood pressure  115/65, pulse 69, temperature 98 F (36.7 C), temperature source Oral, resp. rate 16, height  (1.778 m), weight 89.54 kg (197 lb 6.4 oz), last menstrual period 09/29/2014.  Physical Exam  Constitutional: She is oriented to person, place, and time. She appears well-developed and well-nourished. No distress.  HENT:  Head: Normocephalic and atraumatic.  Eyes: Pupils are equal, round, and reactive to light.  GI: Soft. There is tenderness. There is no rebound and no guarding.  Tenderness across the entire pelvic region  Genitourinary:  Genital:external negative Vaginal:small amount white discharge  Cervix:closed/ thick Bimanual: gravid/ tender over entire pelvic region   Neurological: She is alert and oriented to person, place, and time.  Skin: Skin is warm and dry.  Psychiatric: She has a normal mood and affect. Her behavior is normal. Judgment and thought content normal.   Results for orders placed or performed during the hospital encounter of 11/23/14 (from the past 24 hour(s))  Urinalysis, Routine w reflex microscopic     Status: Abnormal   Collection Time: 11/23/14  5:10 PM  Result Value Ref Range   Color, Urine YELLOW YELLOW   APPearance CLEAR CLEAR   Specific Gravity, Urine >1.030 (H) 1.005 - 1.030   pH 6.0 5.0 - 8.0   Glucose, UA NEGATIVE NEGATIVE mg/dL   Hgb urine dipstick NEGATIVE NEGATIVE   Bilirubin Urine NEGATIVE NEGATIVE   Ketones, ur >80 (A) NEGATIVE mg/dL   Protein, ur  NEGATIVE NEGATIVE mg/dL   Urobilinogen, UA 0.2 0.0 - 1.0 mg/dL   Nitrite NEGATIVE NEGATIVE   Leukocytes, UA TRACE (A) NEGATIVE  Urine microscopic-add on     Status: Abnormal   Collection Time: 11/23/14  5:10 PM  Result Value Ref Range   Squamous Epithelial / LPF MANY (A) RARE   WBC, UA 7-10 <3 WBC/hpf   RBC / HPF 0-2 <3 RBC/hpf   Bacteria, UA MANY (A) RARE  Pregnancy, urine POC     Status: Abnormal   Collection Time: 11/23/14  5:46 PM  Result Value Ref Range   Preg Test, Ur POSITIVE (A)  NEGATIVE  Wet prep, genital     Status: Abnormal   Collection Time: 11/23/14  5:50 PM  Result Value Ref Range   Yeast Wet Prep HPF POC NONE SEEN NONE SEEN   Trich, Wet Prep NONE SEEN NONE SEEN   Clue Cells Wet Prep HPF POC FEW (A) NONE SEEN   WBC, Wet Prep HPF POC FEW (A) NONE SEEN  CBC     Status: None   Collection Time: 11/23/14  6:15 PM  Result Value Ref Range   WBC 6.9 4.0 - 10.5 K/uL   RBC 4.53 3.87 - 5.11 MIL/uL   Hemoglobin 13.0 12.0 - 15.0 g/dL   HCT 16.1 09.6 - 04.5 %   MCV 87.0 78.0 - 100.0 fL   MCH 28.7 26.0 - 34.0 pg   MCHC 33.0 30.0 - 36.0 g/dL   RDW 40.9 81.1 - 91.4 %   Platelets 249 150 - 400 K/uL  hCG, quantitative, pregnancy     Status: Abnormal   Collection Time: 11/23/14  6:15 PM  Result Value Ref Range   hCG, Beta Chain, Quant, S 55124 (H) <5 mIU/mL  . US Ob Comp Less 14 Wks  11/23/2014   CLINICAL DATA:  Initial encounter for pain and cramping with positive pregnancy test.  EXAM: OBSTETRIC <14 WK [place] AND TRANSVAGINAL OB US  TECHNIQUE: Both transabdominal and transvaginal ultrasound examinations were performed for complete evaluation of the gestation as well as the maternal uterus, adnexal regions, and pelvic cul-de-sac. Transvaginal technique was performed to assess early pregnancy.  COMPARISON:  None.  FINDINGS: Intrauterine gestational sac: Single.  Yolk sac:  Visualized  Embryo:  Visualized  Cardiac Activity: Visualized  Heart Rate:  146 bpm  CRL:   9.9  mm   7 w 1 d                  [place] EDC: 07/11/2015  Maternal uterus/adnexae: Small subchorionic hemorrhage is evident. Maternal ovaries are unremarkable. No free fluid in the cul-de-sac although there is a trace amount identified in the left adnexal space.  IMPRESSION: Single living intrauterine gestation at estimated 7 week 1 day gestational age by crown-rump length.   Electronically Signed   By: Kennith Center M.D.   On: 11/23/2014 19:34   US Ob Transvaginal  11/23/2014   CLINICAL DATA:  Initial encounter for pain and  cramping with positive pregnancy test.  EXAM: OBSTETRIC <14 WK [place] AND TRANSVAGINAL OB US  TECHNIQUE: Both transabdominal and transvaginal ultrasound examinations were performed for complete evaluation of the gestation as well as the maternal uterus, adnexal regions, and pelvic cul-de-sac. Transvaginal technique was performed to assess early pregnancy.  COMPARISON:  None.  FINDINGS: Intrauterine gestational sac: Single.  Yolk sac:  Visualized  Embryo:  Visualized  Cardiac Activity: Visualized  Heart Rate:  146 bpm  CRL:   9.9  mm   7 w 1 d                  [place] EDC: 07/11/2015  Maternal uterus/adnexae: Small subchorionic hemorrhage is evident. Maternal ovaries are unremarkable. No free fluid in the cul-de-sac although there is a trace amount identified in the left adnexal space.  IMPRESSION: Single living intrauterine gestation at estimated 7 week 1 day gestational age by crown-rump length.   Electronically Signed   By: Kennith Center M.D.   On: 11/23/2014 19:34    MAU Course  Procedures  MDM Wet prep, GC, Chlamydia, CBC, UA, U/S, ABORh, Quant, HIV, urine culture Tylenol, Reglan IV LR Transfer care at 8 pm to Dorathy Kinsman CNMW  Assessment and Plan   A: Abdominal pain in early pregnancy Possible UTI Nausea and vomiting in pregnancy   P: Keflex 500 mg QID x 7 days Reglan 10 mg po q8 hours prn Phenergan 25 mg po q8 hours Provider list given Increase fluids/ tylenol   Carolynn Serve 11/23/2014, 7:46 PM

## 2014-11-24 LAB — ABO/RH: ABO/RH(D): A POS

## 2014-11-24 LAB — HIV ANTIBODY (ROUTINE TESTING W REFLEX): HIV 1&2 Ab, 4th Generation: NONREACTIVE

## 2014-11-26 LAB — CULTURE, OB URINE: SPECIAL REQUESTS: NORMAL

## 2014-11-27 LAB — GC/CHLAMYDIA PROBE AMP
CT Probe RNA: NEGATIVE
GC PROBE AMP APTIMA: NEGATIVE

## 2015-01-28 LAB — OB RESULTS CONSOLE RUBELLA ANTIBODY, IGM: RUBELLA: IMMUNE

## 2015-01-28 LAB — OB RESULTS CONSOLE GC/CHLAMYDIA
Chlamydia: NEGATIVE
Gonorrhea: NEGATIVE

## 2015-01-28 LAB — OB RESULTS CONSOLE RPR: RPR: NONREACTIVE

## 2015-01-28 LAB — OB RESULTS CONSOLE ABO/RH: RH Type: POSITIVE

## 2015-01-28 LAB — OB RESULTS CONSOLE ANTIBODY SCREEN: Antibody Screen: NEGATIVE

## 2015-01-28 LAB — OB RESULTS CONSOLE HIV ANTIBODY (ROUTINE TESTING): HIV: NONREACTIVE

## 2015-01-28 LAB — OB RESULTS CONSOLE HEPATITIS B SURFACE ANTIGEN: Hepatitis B Surface Ag: NEGATIVE

## 2015-05-23 ENCOUNTER — Inpatient Hospital Stay (HOSPITAL_COMMUNITY)
Admission: AD | Admit: 2015-05-23 | Discharge: 2015-05-23 | Disposition: A | Discharge status: Home / Self Care | Payer: Medicaid Other | Source: Ambulatory Visit | Attending: Obstetrics & Gynecology | Admitting: Obstetrics & Gynecology

## 2015-05-23 ENCOUNTER — Encounter (HOSPITAL_COMMUNITY): Payer: Self-pay | Admitting: *Deleted

## 2015-05-23 DIAGNOSIS — Z87891 Personal history of nicotine dependence: Secondary | ICD-10-CM | POA: Diagnosis not present

## 2015-05-23 DIAGNOSIS — O4700 False labor before 37 completed weeks of gestation, unspecified trimester: Secondary | ICD-10-CM

## 2015-05-23 DIAGNOSIS — Z3A33 33 weeks gestation of pregnancy: Secondary | ICD-10-CM | POA: Insufficient documentation

## 2015-05-23 DIAGNOSIS — O479 False labor, unspecified: Secondary | ICD-10-CM

## 2015-05-23 LAB — WET PREP, GENITAL
Clue Cells Wet Prep HPF POC: NONE SEEN
Trich, Wet Prep: NONE SEEN
Yeast Wet Prep HPF POC: NONE SEEN

## 2015-05-23 LAB — URINE MICROSCOPIC-ADD ON

## 2015-05-23 LAB — URINALYSIS, ROUTINE W REFLEX MICROSCOPIC
Bilirubin Urine: NEGATIVE
Glucose, UA: NEGATIVE mg/dL
HGB URINE DIPSTICK: NEGATIVE
Ketones, ur: NEGATIVE mg/dL
NITRITE: NEGATIVE
PROTEIN: NEGATIVE mg/dL
Specific Gravity, Urine: 1.015 (ref 1.005–1.030)
Urobilinogen, UA: 0.2 mg/dL (ref 0.0–1.0)
pH: 7 (ref 5.0–8.0)

## 2015-05-23 LAB — FETAL FIBRONECTIN: Fetal Fibronectin: NEGATIVE

## 2015-05-23 NOTE — Discharge Instructions (Signed)
Braxton Hicks Contractions °Contractions of the uterus can occur throughout pregnancy. Contractions are not always a sign that you are in labor.  °WHAT ARE BRAXTON HICKS CONTRACTIONS?  °Contractions that occur before labor are called Braxton Hicks contractions, or false labor. Toward the end of pregnancy (32-34 weeks), these contractions can develop more often and may become more forceful. This is not true labor because these contractions do not result in opening (dilatation) and thinning of the cervix. They are sometimes difficult to tell apart from true labor because these contractions can be forceful and people have different pain tolerances. You should not feel embarrassed if you go to the hospital with false labor. Sometimes, the only way to tell if you are in true labor is for your health care provider to look for changes in the cervix. °If there are no prenatal problems or other health problems associated with the pregnancy, it is completely safe to be sent home with false labor and await the onset of true labor. °HOW CAN YOU TELL THE DIFFERENCE BETWEEN TRUE AND FALSE LABOR? °False Labor °· The contractions of false labor are usually shorter and not as hard as those of true labor.   °· The contractions are usually irregular.   °· The contractions are often felt in the front of the lower abdomen and in the groin.   °· The contractions may go away when you walk around or change positions while lying down.   °· The contractions get weaker and are shorter lasting as time goes on.   °· The contractions do not usually become progressively stronger, regular, and closer together as with true labor.   °True Labor °· Contractions in true labor last 30-70 seconds, become very regular, usually become more intense, and increase in frequency.   °· The contractions do not go away with walking.   °· The discomfort is usually felt in the top of the uterus and spreads to the lower abdomen and low back.   °· True labor can be  determined by your health care provider with an exam. This will show that the cervix is dilating and getting thinner.   °WHAT TO REMEMBER °· Keep up with your usual exercises and follow other instructions given by your health care provider.   °· Take medicines as directed by your health care provider.   °· Keep your regular prenatal appointments.   °· Eat and drink lightly if you think you are going into labor.   °· If Braxton Hicks contractions are making you uncomfortable:   °¨ Change your position from lying down or resting to walking, or from walking to resting.   °¨ Sit and rest in a tub of warm water.   °¨ Drink 2-3 glasses of water. Dehydration may cause these contractions.   °¨ Do slow and deep breathing several times an hour.   °WHEN SHOULD I SEEK IMMEDIATE MEDICAL CARE? °Seek immediate medical care if: °· Your contractions become stronger, more regular, and closer together.   °· You have fluid leaking or gushing from your vagina.   °· You have a fever.   °· You pass blood-tinged mucus.   °· You have vaginal bleeding.   °· You have continuous abdominal pain.   °· You have low back pain that you never had before.   °· You feel your baby's head pushing down and causing pelvic pressure.   °· Your baby is not moving as much as it used to.   °Document Released: 11/08/2005 Document Revised: 11/13/2013 Document Reviewed: 08/20/2013 °[employer]® Patient Information ©2015 [employer]. This information is not intended to replace advice given to you by your health care   provider. Make sure you discuss any questions you have with your health care provider. ° °

## 2015-05-23 NOTE — MAU Note (Signed)
Started contracting while at work around 3 p.m.

## 2015-05-23 NOTE — MAU Provider Note (Signed)
  History  [age] yo G3P2002 @ 33.5 wks presents to MAU w/ c/o contractions since earlier today. +FM. Denies leaking or bleeding.  States ctxs resolved on arrival.  Patient Active Problem List   Diagnosis Date Noted  . Preterm contractions 05/23/2015  . MIGRAINE, UNSPEC., W/O INTRACTABLE MIGRAINE 01/19/2007  . ECZEMA, ATOPIC DERMATITIS 01/19/2007    Chief Complaint  Patient presents with  . Contractions   HPI As above OB History    Gravida Para Term Preterm AB TAB SAB Ectopic Multiple Living   3 2 2       2       Past Medical History  Diagnosis Date  . Migraines     Past Surgical History  Procedure Laterality Date  . No past surgeries      History reviewed. No pertinent family history.  History  Substance Use Topics  . Smoking status: Former Smoker -- 0.50 packs/day    Types: Cigarettes    Quit date: 09/23/2014  . Smokeless tobacco: Not on file  . Alcohol Use: No    Allergies: No Known Allergies  No prescriptions prior to admission    ROS  +FM -VB -LOF +Ctxs Physical Exam   Results for orders placed or performed during the hospital encounter of 05/23/15 (from the past 24 hour(s))  Urinalysis, Routine w reflex microscopic ([facility])     Status: Abnormal   Collection Time: 05/23/15  5:55 PM  Result Value Ref Range   Color, Urine YELLOW YELLOW   APPearance CLOUDY (A) CLEAR   Specific Gravity, Urine 1.015 1.005 - 1.030   pH 7.0 5.0 - 8.0   Glucose, UA NEGATIVE NEGATIVE mg/dL   Hgb urine dipstick NEGATIVE NEGATIVE   Bilirubin Urine NEGATIVE NEGATIVE   Ketones, ur NEGATIVE NEGATIVE mg/dL   Protein, ur NEGATIVE NEGATIVE mg/dL   Urobilinogen, UA 0.2 0.0 - 1.0 mg/dL   Nitrite NEGATIVE NEGATIVE   Leukocytes, UA SMALL (A) NEGATIVE  Urine microscopic-add on     Status: Abnormal   Collection Time: 05/23/15  5:55 PM  Result Value Ref Range   Squamous Epithelial / LPF FEW (A) RARE   WBC, UA 3-6 <3 WBC/hpf   RBC / HPF 0-2 <3 RBC/hpf   Bacteria, UA RARE  RARE   Urine-Other AMORPHOUS URATES/PHOSPHATES   Fetal fibronectin     Status: None   Collection Time: 05/23/15  7:17 PM  Result Value Ref Range   Fetal Fibronectin NEGATIVE NEGATIVE  Wet prep, genital     Status: Abnormal   Collection Time: 05/23/15  8:38 PM  Result Value Ref Range   Yeast Wet Prep HPF POC NONE SEEN NONE SEEN   Trich, Wet Prep NONE SEEN NONE SEEN   Clue Cells Wet Prep HPF POC NONE SEEN NONE SEEN   WBC, Wet Prep HPF POC MODERATE (A) NONE SEEN    Blood pressure 106/65, pulse 92, temperature 98.5 F (36.9 C), temperature source Oral, resp. rate 16, last menstrual period 09/29/2014.    Physical Exam Gen: NAD Abd: gravid, NT, no guarding or rebound Spec: Small amount of non odorous discharge present. Sample collected for wet prep and GC/CT Pelvic: Long/closed FHRT: BL 135 w/ moderate variability, +accels, no decels U/C's: None ED Course  Assessment: Not in PTL fFN neg Wet prep neg  Plan: Reassurance PTL precautions Continue daily fetal movement counts per protocol Office f/u as previously scheduled   Sherre ScarletWILLIAMS, Abimelec Grochowski CNM, MS 05/23/15, 08:41 PM

## 2015-05-27 LAB — GC/CHLAMYDIA PROBE AMP (~~LOC~~) NOT AT ARMC
Chlamydia: NEGATIVE
NEISSERIA GONORRHEA: NEGATIVE

## 2015-06-06 ENCOUNTER — Inpatient Hospital Stay (HOSPITAL_COMMUNITY)
Admission: AD | Admit: 2015-06-06 | Payer: Medicaid Other | Source: Ambulatory Visit | Admitting: Obstetrics & Gynecology

## 2015-06-12 LAB — OB RESULTS CONSOLE GBS: GBS: NEGATIVE

## 2015-07-04 ENCOUNTER — Telehealth (HOSPITAL_COMMUNITY): Payer: Self-pay | Admitting: *Deleted

## 2015-07-04 ENCOUNTER — Encounter (HOSPITAL_COMMUNITY): Payer: Self-pay | Admitting: *Deleted

## 2015-07-04 NOTE — Telephone Encounter (Signed)
Preadmission screen  

## 2015-07-13 ENCOUNTER — Encounter (HOSPITAL_COMMUNITY): Payer: Self-pay

## 2015-07-13 ENCOUNTER — Inpatient Hospital Stay (HOSPITAL_COMMUNITY)
Admission: RE | Admit: 2015-07-13 | Discharge: 2015-07-15 | DRG: 988 | Disposition: A | Discharge status: Home / Self Care | Payer: BLUE CROSS/BLUE SHIELD | Source: Ambulatory Visit | Attending: Obstetrics and Gynecology | Admitting: Obstetrics and Gynecology

## 2015-07-13 ENCOUNTER — Inpatient Hospital Stay (HOSPITAL_COMMUNITY): Payer: BLUE CROSS/BLUE SHIELD | Admitting: Anesthesiology

## 2015-07-13 DIAGNOSIS — G43909 Migraine, unspecified, not intractable, without status migrainosus: Secondary | ICD-10-CM | POA: Diagnosis present

## 2015-07-13 DIAGNOSIS — O99354 Diseases of the nervous system complicating childbirth: Secondary | ICD-10-CM | POA: Diagnosis present

## 2015-07-13 DIAGNOSIS — Z3A41 41 weeks gestation of pregnancy: Secondary | ICD-10-CM | POA: Diagnosis present

## 2015-07-13 DIAGNOSIS — O480 Post-term pregnancy: Secondary | ICD-10-CM | POA: Diagnosis present

## 2015-07-13 DIAGNOSIS — Z87891 Personal history of nicotine dependence: Secondary | ICD-10-CM

## 2015-07-13 LAB — CBC
HCT: 35.6 % — ABNORMAL LOW (ref 36.0–46.0)
Hemoglobin: 11.4 g/dL — ABNORMAL LOW (ref 12.0–15.0)
MCH: 28.1 pg (ref 26.0–34.0)
MCHC: 32 g/dL (ref 30.0–36.0)
MCV: 87.7 fL (ref 78.0–100.0)
Platelets: 252 K/uL (ref 150–400)
RBC: 4.06 MIL/uL (ref 3.87–5.11)
RDW: 14.4 % (ref 11.5–15.5)
WBC: 7.8 K/uL (ref 4.0–10.5)

## 2015-07-13 LAB — TYPE AND SCREEN
ABO/RH(D): A POS
Antibody Screen: NEGATIVE

## 2015-07-13 MED ORDER — ONDANSETRON HCL 4 MG/2ML IJ SOLN: 4.0000 mg | Freq: Four times a day (QID) | INTRAMUSCULAR | Status: DC | PRN

## 2015-07-13 MED ORDER — OXYCODONE-ACETAMINOPHEN 5-325 MG PO TABS: 1.0000 | ORAL_TABLET | ORAL | Status: DC | PRN

## 2015-07-13 MED ORDER — BENZOCAINE-MENTHOL 20-0.5 % EX AERO
1.0000 "application " | INHALATION_SPRAY | CUTANEOUS | Status: DC | PRN
  Administered 2015-07-14: 1 via TOPICAL
  Filled 2015-07-13 (×2): qty 56

## 2015-07-13 MED ORDER — LACTATED RINGERS IV SOLN
INTRAVENOUS | Status: DC
  Administered 2015-07-13 (×2): via INTRAVENOUS

## 2015-07-13 MED ORDER — ACETAMINOPHEN 325 MG PO TABS: 650.0000 mg | ORAL_TABLET | ORAL | Status: DC | PRN

## 2015-07-13 MED ORDER — LIDOCAINE HCL (PF) 1 % IJ SOLN
30.0000 mL | INTRAMUSCULAR | Status: AC | PRN
  Administered 2015-07-13 (×2): 30 mL via SUBCUTANEOUS
  Filled 2015-07-13: qty 30

## 2015-07-13 MED ORDER — OXYCODONE-ACETAMINOPHEN 5-325 MG PO TABS
1.0000 | ORAL_TABLET | ORAL | Status: DC | PRN
  Administered 2015-07-14 – 2015-07-15 (×3): 1 via ORAL
  Filled 2015-07-13 (×3): qty 1

## 2015-07-13 MED ORDER — LIDOCAINE HCL (PF) 1 % IJ SOLN
INTRAMUSCULAR | Status: AC
  Administered 2015-07-13: 30 mL via SUBCUTANEOUS
  Filled 2015-07-13: qty 30

## 2015-07-13 MED ORDER — DIPHENHYDRAMINE HCL 50 MG/ML IJ SOLN: 12.5000 mg | Freq: Four times a day (QID) | INTRAMUSCULAR | Status: DC | PRN

## 2015-07-13 MED ORDER — OXYCODONE-ACETAMINOPHEN 5-325 MG PO TABS: 2.0000 | ORAL_TABLET | ORAL | Status: DC | PRN

## 2015-07-13 MED ORDER — LANOLIN HYDROUS EX OINT: TOPICAL_OINTMENT | CUTANEOUS | Status: DC | PRN

## 2015-07-13 MED ORDER — TETANUS-DIPHTH-ACELL PERTUSSIS 5-2.5-18.5 LF-MCG/0.5 IM SUSP
0.5000 mL | Freq: Once | INTRAMUSCULAR | Status: DC
  Filled 2015-07-13: qty 0.5

## 2015-07-13 MED ORDER — OXYTOCIN BOLUS FROM INFUSION
500.0000 mL | INTRAVENOUS | Status: DC
Start: 2015-07-13 — End: 2015-07-13

## 2015-07-13 MED ORDER — CITRIC ACID-SODIUM CITRATE 334-500 MG/5ML PO SOLN: 30.0000 mL | ORAL | Status: DC | PRN

## 2015-07-13 MED ORDER — FENTANYL CITRATE (PF) 100 MCG/2ML IJ SOLN
100.0000 ug | INTRAMUSCULAR | Status: DC | PRN
  Administered 2015-07-13: 100 ug via INTRAVENOUS

## 2015-07-13 MED ORDER — FLEET ENEMA 7-19 GM/118ML RE ENEM: 1.0000 | ENEMA | RECTAL | Status: DC | PRN

## 2015-07-13 MED ORDER — FENTANYL 2.5 MCG/ML BUPIVACAINE 1/10 % EPIDURAL INFUSION (WH - ANES): 14.0000 mL/h | INTRAMUSCULAR | Status: DC | PRN

## 2015-07-13 MED ORDER — LACTATED RINGERS IV SOLN: 500.0000 mL | INTRAVENOUS | Status: DC | PRN

## 2015-07-13 MED ORDER — DIPHENHYDRAMINE HCL 50 MG/ML IJ SOLN
12.5000 mg | INTRAMUSCULAR | Status: AC | PRN
  Administered 2015-07-13 (×3): 12.5 mg via INTRAVENOUS
  Filled 2015-07-13: qty 1

## 2015-07-13 MED ORDER — WITCH HAZEL-GLYCERIN EX PADS
1.0000 "application " | MEDICATED_PAD | CUTANEOUS | Status: DC | PRN
  Administered 2015-07-14: 1 via TOPICAL

## 2015-07-13 MED ORDER — ZOLPIDEM TARTRATE 5 MG PO TABS: 5.0000 mg | ORAL_TABLET | Freq: Every evening | ORAL | Status: DC | PRN

## 2015-07-13 MED ORDER — SENNOSIDES-DOCUSATE SODIUM 8.6-50 MG PO TABS
2.0000 | ORAL_TABLET | ORAL | Status: DC
  Administered 2015-07-13 – 2015-07-14 (×2): 2 via ORAL
  Filled 2015-07-13 (×2): qty 2

## 2015-07-13 MED ORDER — SIMETHICONE 80 MG PO CHEW: 80.0000 mg | CHEWABLE_TABLET | ORAL | Status: DC | PRN

## 2015-07-13 MED ORDER — ONDANSETRON HCL 4 MG PO TABS: 4.0000 mg | ORAL_TABLET | ORAL | Status: DC | PRN

## 2015-07-13 MED ORDER — PHENYLEPHRINE 40 MCG/ML (10ML) SYRINGE FOR IV PUSH (FOR BLOOD PRESSURE SUPPORT)
80.0000 ug | PREFILLED_SYRINGE | INTRAVENOUS | Status: DC | PRN
  Filled 2015-07-13: qty 20

## 2015-07-13 MED ORDER — DIBUCAINE 1 % RE OINT
1.0000 "application " | TOPICAL_OINTMENT | RECTAL | Status: DC | PRN
  Administered 2015-07-14: 1 via RECTAL
  Filled 2015-07-13 (×2): qty 28

## 2015-07-13 MED ORDER — OXYTOCIN 40 UNITS IN LACTATED RINGERS INFUSION - SIMPLE MED: 62.5000 mL/h | INTRAVENOUS | Status: DC

## 2015-07-13 MED ORDER — OXYTOCIN 40 UNITS IN LACTATED RINGERS INFUSION - SIMPLE MED
1.0000 m[IU]/min | INTRAVENOUS | Status: DC
  Administered 2015-07-13: 1 m[IU]/min via INTRAVENOUS
  Filled 2015-07-13: qty 1000

## 2015-07-13 MED ORDER — FENTANYL 2.5 MCG/ML BUPIVACAINE 1/10 % EPIDURAL INFUSION (WH - ANES)
14.0000 mL/h | INTRAMUSCULAR | Status: DC | PRN
  Administered 2015-07-13: 14 mL/h via EPIDURAL
  Filled 2015-07-13: qty 125

## 2015-07-13 MED ORDER — FENTANYL CITRATE (PF) 100 MCG/2ML IJ SOLN
INTRAMUSCULAR | Status: AC
  Filled 2015-07-13: qty 2

## 2015-07-13 MED ORDER — TERBUTALINE SULFATE 1 MG/ML IJ SOLN: 0.2500 mg | Freq: Once | INTRAMUSCULAR | Status: DC | PRN

## 2015-07-13 MED ORDER — FENTANYL 2.5 MCG/ML BUPIVACAINE 1/10 % EPIDURAL INFUSION (WH - ANES)
INTRAMUSCULAR | Status: DC | PRN
  Administered 2015-07-13: 14 mL/h via EPIDURAL

## 2015-07-13 MED ORDER — IBUPROFEN 600 MG PO TABS
600.0000 mg | ORAL_TABLET | Freq: Four times a day (QID) | ORAL | Status: DC
  Administered 2015-07-13 – 2015-07-15 (×6): 600 mg via ORAL
  Filled 2015-07-13 (×6): qty 1

## 2015-07-13 MED ORDER — LIDOCAINE HCL (PF) 1 % IJ SOLN
INTRAMUSCULAR | Status: DC | PRN
  Administered 2015-07-13 (×2): 5 mL

## 2015-07-13 MED ORDER — PRENATAL MULTIVITAMIN CH
1.0000 | ORAL_TABLET | Freq: Every day | ORAL | Status: DC
  Administered 2015-07-14: 1 via ORAL
  Filled 2015-07-13: qty 1

## 2015-07-13 MED ORDER — DIPHENHYDRAMINE HCL 25 MG PO CAPS
25.0000 mg | ORAL_CAPSULE | Freq: Four times a day (QID) | ORAL | Status: DC | PRN
  Administered 2015-07-13: 25 mg via ORAL
  Filled 2015-07-13: qty 1

## 2015-07-13 MED ORDER — ONDANSETRON HCL 4 MG/2ML IJ SOLN: 4.0000 mg | INTRAMUSCULAR | Status: DC | PRN

## 2015-07-13 MED ORDER — EPHEDRINE 5 MG/ML INJ: 10.0000 mg | INTRAVENOUS | Status: DC | PRN

## 2015-07-13 NOTE — H&P (Signed)
Sally Olson is a 29 y.o. female, G3P2002 at 36 weeks, presenting for induction due to post-dates.  Denies leaking or bleeding, reports +FM.  Patient Active Problem List   Diagnosis Date Noted  . Normal labor 07/13/2015  . Preterm contractions 05/23/2015  . MIGRAINE, UNSPEC., W/O INTRACTABLE MIGRAINE 01/19/2007  . ECZEMA, ATOPIC DERMATITIS 01/19/2007    History of present pregnancy: Patient entered care at 17 3/7 weeks.   EDC of 07/06/15 was established by LMP and in agreement with [place] at 7 1/7 weeks   Anatomy scan: 20 1/7 weeks, with normal findings and a fundal/posterior placenta.   Additional [place] evaluations:  None.   Significant prenatal events: Reported random episodes of SOB during early pregnancy, with patient feeling like they were related to anxiety/panic.  Did not persist in later pregnancy.  Seen in MAU at 33 weeks for UCs, negative FFN. Last evaluation:  07/09/15--cervix 2 cm, 50%, vtx, -3.  Scheduled for induction at 41 weeks.  OB History    Gravida Para Term Preterm AB TAB SAB Ectopic Multiple Living   2008--[facility], 39 weeks, 6 lbs, epidural, rapid labor, [facility]--[facility] 2011--[facility], 39 4/7 weeks, 6 lbs, epidural, rapid labor, [facility]--[facility]  Past Medical History  Diagnosis Date  . Migraines   . Eczema    Past Surgical History  Procedure Laterality Date  . No past surgeries     Family History: family history includes Sickle cell anemia in her sister.   Social History:  reports that she quit smoking about 9 months ago. Her smoking use included Cigarettes. She smoked 0.50 packs per day. She does not have any smokeless tobacco history on file. She reports that she does not drink alcohol or use illicit drugs.  Patient is Sally Olson, single, with FOB, Sally Olson representative, involved and sup   Prenatal Transfer Tool  Maternal Diabetes: No Genetic Screening: Declined Maternal Ultrasounds/Referrals: Normal Fetal Ultrasounds or other Referrals:   None Maternal Substance Abuse:  No Significant Maternal Medications:  None Significant Maternal Lab Results: Lab values include: Group B Strep negative  TDAP 04/14/15 Flu NA  ROS:  +FM, occasional UCs  No Known Allergies   Dilation: 2.5 Effacement (%): 70 Station: -1 Exam by:: Sally Olson, cnm Blood pressure 116/76, pulse 97, temperature 98.1 F (36.7 C), temperature source Oral, resp. rate 20, last menstrual period 09/29/2014.  Chest clear Heart RRR without murmur Abd gravid, NT, FH 39 cm Pelvic: As above, cervix soft. Ext: WNL  FHR: Category 1 UCs:  None  Prenatal labs: ABO, Rh: A/Positive/-- (03/08 0000) Antibody: Negative (03/08 0000) Rubella:   Immune RPR: Nonreactive (03/08 0000)  HBsAg: Negative (03/08 0000)  HIV: Non-reactive (03/08 0000)  GBS: Negative (07/21 0000) Sickle cell/Hgb electrophoresis:  AA Pap:  [facility] at [facility]: Negative 01/28/15 Chlamydia:  Negative 01/28/15 Genetic screenings: Declined Glucola:  WNL Other:   Hgb 12.9 at NOB, 10.9 at 28 weeks       Assessment/Plan: IUP at 41 weeks Induction for post-dates GBS negative  Plan: Admit to Birthing Suite per consult with Dr. Normand Sloop Routine [facility] orders Pain med/epidural prn Plan pitocin induction.  Sally Olson, Sally Olson, Sally Olson 07/13/2015, 8:27 AM

## 2015-07-13 NOTE — Anesthesia Procedure Notes (Signed)
Epidural Patient location during procedure: OB Start time: 07/13/2015 1:20 PM End time: 07/13/2015 1:35 PM  Staffing Anesthesiologist: Sebastian Ache  Preanesthetic Checklist Completed: patient identified, site marked, surgical consent, pre-op evaluation, timeout performed, IV checked, risks and benefits discussed and monitors and equipment checked  Epidural Patient position: sitting Prep: site prepped and draped and DuraPrep Patient monitoring: heart rate, continuous pulse ox and blood pressure Approach: midline Location: L2-L3 Injection technique: LOR air  Needle:  Needle type: Tuohy  Needle gauge: 17 G Needle length: 9 cm and 9 Needle insertion depth: 6 cm Catheter type: closed end flexible Catheter size: 19 Gauge Catheter at skin depth: 14 cm Test dose: negative  Assessment Events: blood not aspirated, injection not painful, no injection resistance, negative IV test and no paresthesia  Additional Notes   Patient tolerated the insertion well without complications.Reason for block:procedure for pain

## 2015-07-13 NOTE — Progress Notes (Signed)
  Subjective: Comfortable with Epidural. Itching status post epidural  Objective: BP 113/79 mmHg  Pulse 79  Temp(Src) 98.3 F (36.8 C) (Oral)  Resp 18  Ht  (1.778 m)  Wt 104.327 kg (230 lb)  BMI 33.00 kg/m2  SpO2 99%  LMP 09/29/2014 (Approximate)     FHT: Category 1  UC:   regular, every 3 minutes SVE:   Dilation: 4.5 Effacement (%): 70 Station: -1 Exam by:: Emilee Hero, CNM  AROM, clear fluid Pitocin at 11 mu/min IUPC placed easily   Assessment:  IOL post dates Early labor  Plan: Benadryl for itching Continuing Pitocin to establish/mantain adequacy of labor   Nigel Bridgeman CNM 07/13/2015, 2:27 PM

## 2015-07-13 NOTE — Anesthesia Preprocedure Evaluation (Signed)
Anesthesia Evaluation  Patient identified by MRN, date of birth, ID band Patient awake and Patient confused    Reviewed: Allergy & Precautions, H&P , NPO status , Patient's Chart, lab work & pertinent test results  Airway Mallampati: II       Dental   Pulmonary former smoker,  breath sounds clear to auscultation  Pulmonary exam normal       Cardiovascular Exercise Tolerance: Good Normal cardiovascular examRhythm:regular Rate:Normal     Neuro/Psych    GI/Hepatic   Endo/Other    Renal/GU      Musculoskeletal   Abdominal   Peds  Hematology   Anesthesia Other Findings   Reproductive/Obstetrics (+) Pregnancy                             Anesthesia Physical Anesthesia Plan  ASA: II  Anesthesia Plan: Epidural   Post-op Pain Management:    Induction:   Airway Management Planned:   Additional Equipment:   Intra-op Plan:   Post-operative Plan:   Informed Consent: I have reviewed the patients History and Physical, chart, labs and discussed the procedure including the risks, benefits and alternatives for the proposed anesthesia with the patient or authorized representative who has indicated his/her understanding and acceptance.     Plan Discussed with:   Anesthesia Plan Comments:         Anesthesia Quick Evaluation  

## 2015-07-13 NOTE — Progress Notes (Signed)
Addendum Called to the room by primary care nurse.  Pt hand not voided since she wasn't able to find th urethral opening.  Upon inspection the repair to the peri-clitoral area came loose.  Pt was uncomfortable with light touch t/f IV pain medicine was given and lidocaine administered to the local area.  Red Robin placed for patency prior to repair. Clear yellow urine drained without difficulty.  4-0 vicryl on a SH needle with 10cc of 1% lidocaine was use.  Red Zella Ball was removed at the completion of the procedure.  Minimum bleeding. Pt tolerate well.

## 2015-07-14 LAB — CBC
HEMATOCRIT: 32.6 % — AB (ref 36.0–46.0)
Hemoglobin: 10.2 g/dL — ABNORMAL LOW (ref 12.0–15.0)
MCH: 27.3 pg (ref 26.0–34.0)
MCHC: 31.3 g/dL (ref 30.0–36.0)
MCV: 87.4 fL (ref 78.0–100.0)
PLATELETS: 194 10*3/uL (ref 150–400)
RBC: 3.73 MIL/uL — ABNORMAL LOW (ref 3.87–5.11)
RDW: 14.5 % (ref 11.5–15.5)
WBC: 9.7 10*3/uL (ref 4.0–10.5)

## 2015-07-14 LAB — HIV ANTIBODY (ROUTINE TESTING W REFLEX): HIV Screen 4th Generation wRfx: NONREACTIVE

## 2015-07-14 LAB — RPR: RPR Ser Ql: NONREACTIVE

## 2015-07-14 NOTE — Progress Notes (Signed)
Sally Olson  Post Partum Day 1:S/P Peri-Clitoral  Subjective: Patient up ad lib, denies syncope or dizziness. Reports consuming regular diet without issues and denies N/V. Denies issues with urination and reports bleeding is "heavy."  Patient is breast and bottle feeding.  Desires for postpartum contraception is undecided.  Pain is being appropriately managed with use of motrin.  Objective: Filed Vitals:   07/13/15 2059 07/13/15 2200 07/14/15 0218 07/14/15 0605  BP: 108/90 120/65 131/61 103/45  Pulse: 73 79 85 73  Temp: 98.4 F (36.9 C) 97.8 F (36.6 C) 98.1 F (36.7 C) 97.9 F (36.6 C)  TempSrc: Oral Oral Oral Oral  Resp: Height:      Weight:      SpO2:        Recent Labs  07/13/15 0735 07/14/15 0550  HGB 11.4* 10.2*  HCT 35.6* 32.6*    Physical Exam:  General: alert, cooperative and no distress Mood/Affect: Appropriate/Tired Lungs: clear to auscultation, no wheezes, rales or rhonchi, symmetric air entry.  Heart: normal rate and regular rhythm. Breast: not examined. Abdomen:  + bowel sounds, Soft, NT Uterine Fundus: firm, +2/U Lochia: appropriate Laceration: Not examined Skin: Warm, Dry DVT Evaluation: No evidence of DVT seen on physical exam. No cords or calf tenderness. No significant calf/ankle edema.  Assessment S/P Vaginal Delivery-Day 1 Normal Involution Breast/BottleFeeding Female Infant-Circumcision   Plan: Patient encouraged to call regarding insurance and infant circumcision Continue current care Discharge tomorrow Dr. Carmela Hurt to be updated on patient status   Savanah Bayles, Joyice Faster, MSN, CNM 07/14/2015, 11:35 AM

## 2015-07-14 NOTE — Lactation Note (Signed)
This note was copied from the chart of Sally Edythe Aber. Lactation Consultation Note; Mom has given some formula because baby was still fussy after nursing. Reports she is having trouble getting him to latch to right breast- has been doing well on the left. Baby showing feeding cues Assisted mom with latch to right breast in football position. Encouraged to always breast feed first to promote a good milk supply. Nursing well when I left room. No questions at present. To call for asssist prn  Patient Name: Sally Olson [id number] Date: 07/14/2015 Reason for consult: Follow-up assessment   Maternal Data Formula Feeding for Exclusion: No Has patient been taught Hand Expression?: Yes Does the patient have breastfeeding experience prior to this delivery?: Yes  Feeding Feeding Type: Breast Fed  LATCH Score/Interventions Latch: Grasps breast easily, tongue down, lips flanged, rhythmical sucking.  Audible Swallowing: A few with stimulation  Type of Nipple: Everted at rest and after stimulation  Comfort (Breast/Nipple): Soft / non-tender     Hold (Positioning): Assistance needed to correctly position infant at breast and maintain latch. Intervention(s): Breastfeeding basics reviewed  LATCH Score: 8  Lactation Tools Discussed/Used     Consult Status Consult Status: Follow-up Date: 07/15/15 Follow-up type: In-patient    Pamelia Hoit 07/14/2015, 2:01 PM

## 2015-07-14 NOTE — Anesthesia Postprocedure Evaluation (Signed)
  Anesthesia Post-op Note  Patient: Sally Olson  Procedure(s) Performed: * No procedures listed *  Patient Location: Mother/Baby  Anesthesia Type:Epidural  Level of Consciousness: awake, alert  and oriented  Airway and Oxygen Therapy: Patient Spontanous Breathing  Post-op Pain: none  Post-op Assessment: Post-op Vital signs reviewed and Patient's Cardiovascular Status Stable              Post-op Vital Signs: Reviewed and stable  Last Vitals:  Filed Vitals:   07/14/15 0605  BP: 103/45  Pulse: 73  Temp: 36.6 C  Resp: 16    Complications: No apparent anesthesia complications

## 2015-07-14 NOTE — Lactation Note (Signed)
This note was copied from the chart of Sally Curley Ayuso. Lactation Consultation Note Mom has BF in past, mainly pumped and bottle fed for 6 months mom stated. Has personal DEBP. Mom states she is going to try to do more breast feeding than pumping and bottle feeding. Baby is eager to BF, mom trying to BF in cradle position, baby keeps loosing latch. Assisted in football position STS. Encouraged mom to roll short shaft nipple. Compressible and able to obtain deep latch. Mom encouraged to feed baby 8-12 times/24 hours and with feeding cues. Hand expression taught to Mom. Educated about newborn behavior, I&O, supply and demand. Referred to Baby and Me Book in Breastfeeding section Pg. 22-23 for position options and Proper latch demonstration.[facility]/LC brochure given w/resources, support groups and LC services. Patient Name: Sally Olson [id number] Date: 07/14/2015 Reason for consult: Initial assessment   Maternal Data Has patient been taught Hand Expression?: Yes Does the patient have breastfeeding experience prior to this delivery?: Yes  Feeding Feeding Type: Breast Fed Length of feed: 15 min  LATCH Score/Interventions Latch: Grasps breast easily, tongue down, lips flanged, rhythmical sucking. Intervention(s): Adjust position;Assist with latch;Breast massage;Breast compression  Audible Swallowing: A few with stimulation Intervention(s): Skin to skin Intervention(s): Hand expression;Alternate breast massage  Type of Nipple: Everted at rest and after stimulation Intervention(s): Double electric pump  Comfort (Breast/Nipple): Soft / non-tender     Hold (Positioning): Assistance needed to correctly position infant at breast and maintain latch. Intervention(s): Skin to skin;Position options;Support Pillows;Breastfeeding basics reviewed  LATCH Score: 8  Lactation Tools Discussed/Used Tools: Pump Breast pump type: Double-Electric Breast Pump   Consult Status Consult Status:  Follow-up Date: 07/15/15 Follow-up type: In-patient    Charyl Dancer 07/14/2015, 12:57 AM

## 2015-07-15 MED ORDER — IBUPROFEN 600 MG PO TABS: 600.0000 mg | ORAL_TABLET | Freq: Four times a day (QID) | ORAL | Status: AC | PRN

## 2015-07-15 MED ORDER — OXYCODONE-ACETAMINOPHEN 5-325 MG PO TABS: 1.0000 | ORAL_TABLET | ORAL | Status: AC | PRN

## 2015-07-15 NOTE — Progress Notes (Signed)
UR chart review completed.  

## 2015-07-15 NOTE — Lactation Note (Signed)
This note was copied from the chart of Boy Gerrie Kaspar. Lactation Consultation Note  Patient Name: [name] Date: 07/15/2015 Reason for consult: Follow-up assessment;Other (Comment) (2 %weight loss )  Baby is 40 hours old , 2 % weight loss, Breast feeding range, 10-60 mins, Average 15 mins,  Latch Scores initially 5, 8"s x 5,  Supplemented x2 5-10 ml , 5 voids , 5 stools. @ 31 hours old Bili Check - 1.8. Per mom breast feeding is going well and baby recently fed for 20 mins. Baby fussy while LC present - wet diaper  Changed and settled down. Per mom nipples tender , no breakdown, LC instructed on the use comfort gels, enc breast  Massage , hand express, and using EBM to nipples liberally , also instructed on the use of comfort gels. Sore nipple and  Engorgement prevention and tx reviewed. Referring to the Baby and me booklet. Mother informed of post-discharge support and given phone number to the lactation department, including services for phone  call assistance; out-patient appointments; and breastfeeding support group. List of other breastfeeding resources in the community  given in the handout. Encouraged mother to call for problems or concerns related to breastfeeding.   Maternal Data    Feeding Feeding Type:  (per mom recently fed ) Length of feed: 20 min (per mom )  LATCH Score/Interventions                Intervention(s): Breastfeeding basics reviewed (referring to the BABY and me booklet )     Lactation Tools Discussed/Used Tools: Comfort gels WIC Program: No   Consult Status Consult Status: Complete Date: 07/15/15    Kathrin Greathouse 07/15/2015, 10:30 AM

## 2015-07-15 NOTE — Discharge Instructions (Signed)

## 2015-07-15 NOTE — Discharge Summary (Signed)
  Vaginal Delivery Discharge Summary  Sally Olson  DOB:    [birth year] MRN:    [id number] CSN:    [id number]  Date of admission:                  07/13/15  Date of discharge:                   07/15/15  Procedures this admission:   SVB, repair of periclitoral laceration x 2  Date of Delivery: 07/13/15  Newborn Data:  Live born female  Birth Weight: 8 lb 8 oz (3855 g) APGAR: 8, 9  Home with mother. Name: Thurston Pounds Circumcision Plan: Outpatient  History of Present Illness:  Ms. Sally Olson is a 29 y.o. female, [phone], who presents at [redacted]w[redacted]d weeks gestation. The patient has been followed at [facility]. She was admitted for induction of labor for postdates. Her pregnancy has been complicated by:  Patient Active Problem List   Diagnosis Date Noted  . Vaginal delivery 07/13/2015  . Preterm contractions 05/23/2015  . MIGRAINE, UNSPEC., W/O INTRACTABLE MIGRAINE 01/19/2007  . ECZEMA, ATOPIC DERMATITIS 01/19/2007     Hospital Course:   Admitting Dx:  IUP at 41 weeks, induction of labor GBS Status:  Negative Delivering Clinician: Nigel Bridgeman, CNM Lacerations/MLE: Periclitoral laceration Complications: Unable to void after delivery.  Initial periclitoral laceration repair taken down to allow for visualization of the urethral opening, due to edema.  Bladder drained, then laceration re-repaired by Venus Standard, CNM.  No further issues with voiding after that.    Intrapartum Procedures: spontaneous vaginal delivery Postpartum Procedures: none Complications-Operative and Postpartum: Periclitoral laceration  Discharge Diagnoses: Term Pregnancy-delivered  Feeding:  breast  Contraception:  Declines at present.  Hemoglobin Results:  CBC Latest Ref Rng 07/14/2015 07/13/2015 11/23/2014  WBC 4.0 - 10.5 K/uL 9.7 7.8 6.9  Hemoglobin 12.0 - 15.0 g/dL 10.2(L) 11.4(L) 13.0  Hematocrit 36.0 - 46.0 % 32.6(L)  35.6(L) 39.4  Platelets 150 - 400 K/uL 194 252 249    Discharge Physical Exam:   General: alert Lochia: appropriate Uterine Fundus: firm Incision: healing well DVT Evaluation: No evidence of DVT seen on physical exam. Negative Homan's sign.   Discharge Information:  Activity:           pelvic rest Diet:                routine Medications: Ibuprofen and Percocet Condition:      stable Instructions:  Routine pp instructions   Discharge to: home  Follow-up Information    Follow up with [facility]. Schedule an appointment as soon as possible for a visit in 6 weeks.   Specialty:  Obstetrics and Gynecology   Why:  Call for any questions or concerns.  Contact office to schedule circumcision.   Contact information:   [address] [place] [address] [phone]       Nigel Bridgeman [facility] 07/15/2015 10:25 AM

## 2016-08-03 IMAGING — US US OB TRANSVAGINAL
2 series · 14 of 28 positions shown · non-contrast
Comparison: None.

CLINICAL DATA: Initial encounter for pain and cramping with
positive pregnancy test.

EXAM:
OBSTETRIC <14 WK US AND TRANSVAGINAL OB US
TECHNIQUE: Both transabdominal and transvaginal ultrasound examinations were
performed for complete evaluation of the gestation as well as the
maternal uterus, adnexal regions, and pelvic cul-de-sac.
Transvaginal technique was performed to assess early pregnancy.

[Series 1: us ob comp less 14 wks · 4 of 13 slices shown (1 of 2)]
[im 2/13]
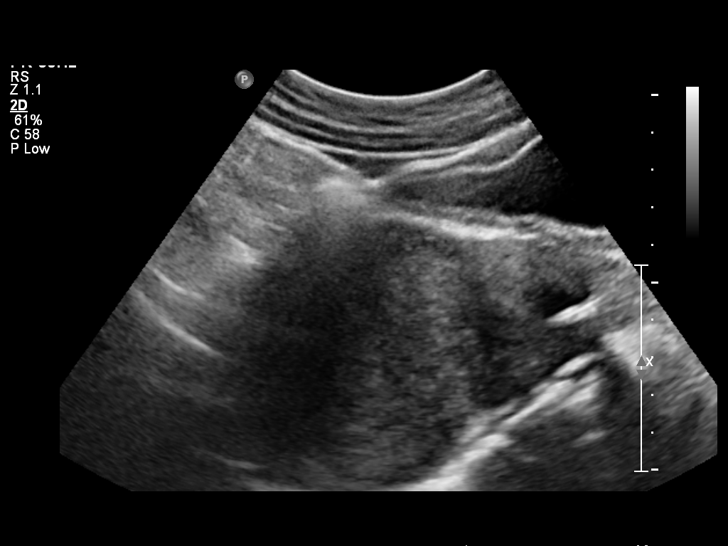
[im 6/13]
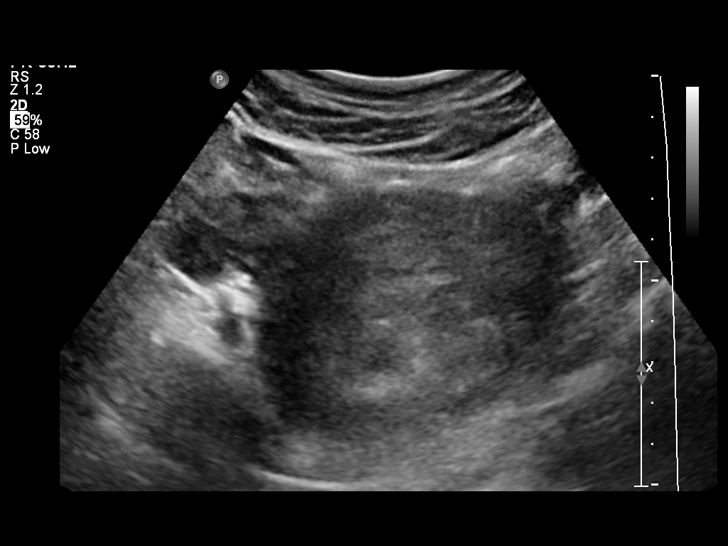
[im 9/13]
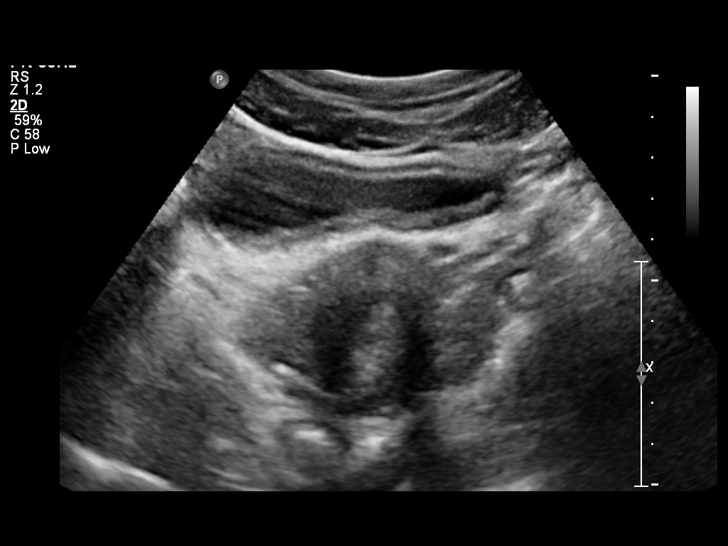
[im 13/13]
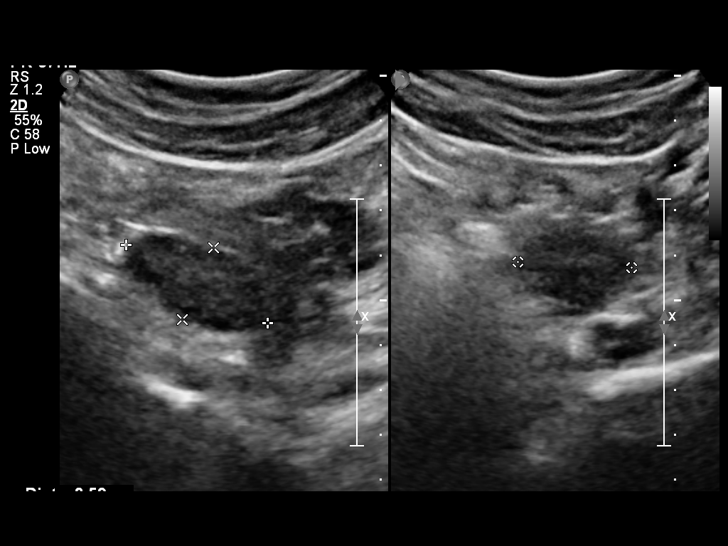

[Series 1: us ob comp less 14 wks · 10 of 30 slices shown (2 of 2)]
[im 2/30]
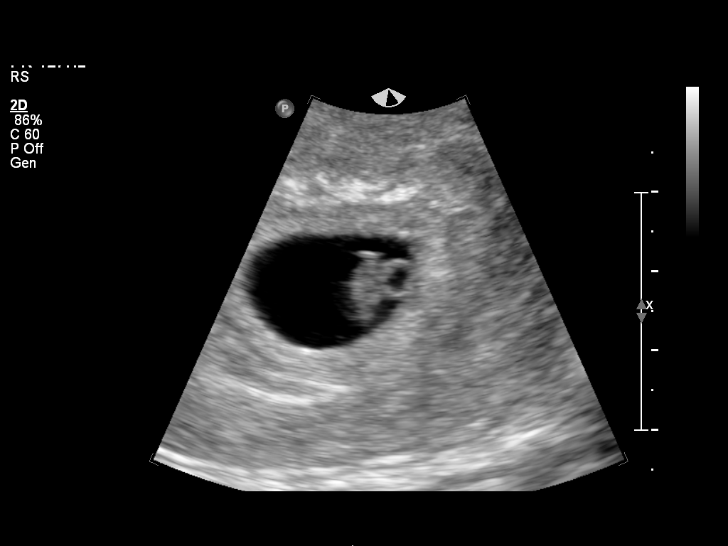
[im 5/30]
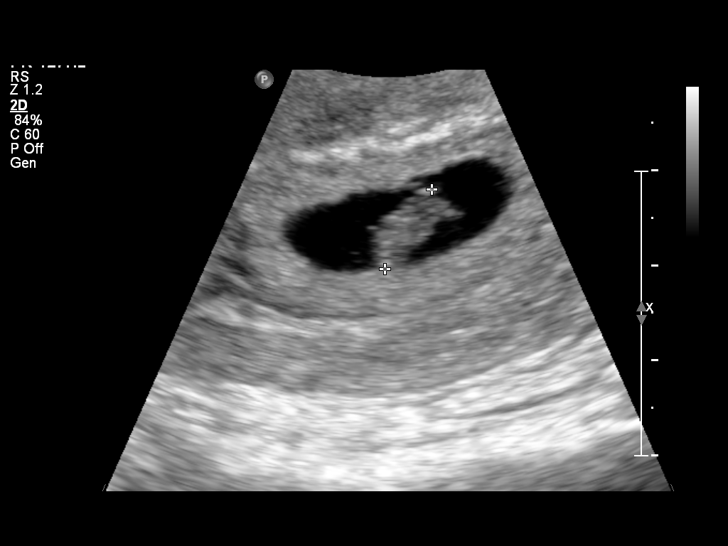
[im 8/30]
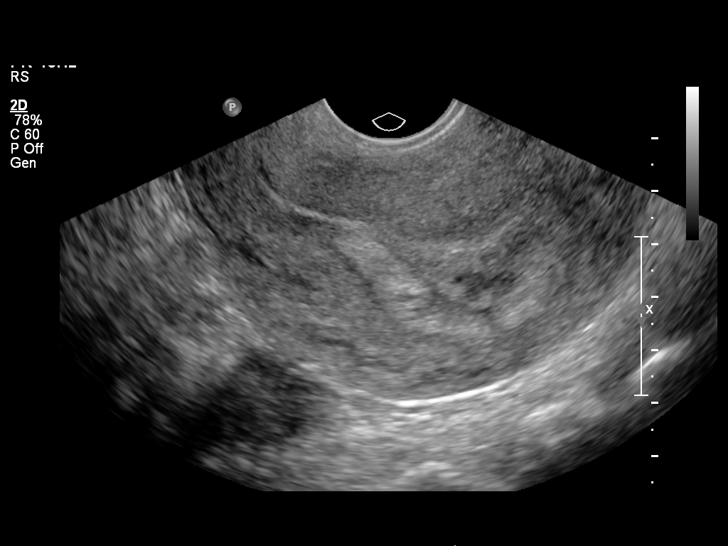
[im 11/30]
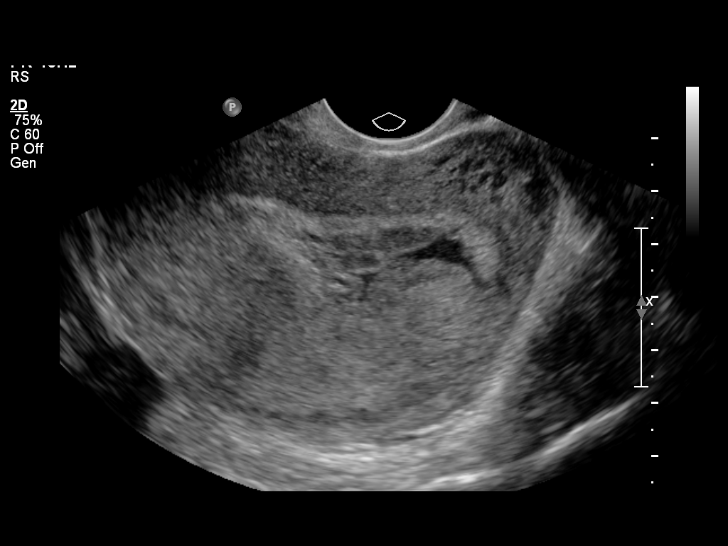
[im 14/30]
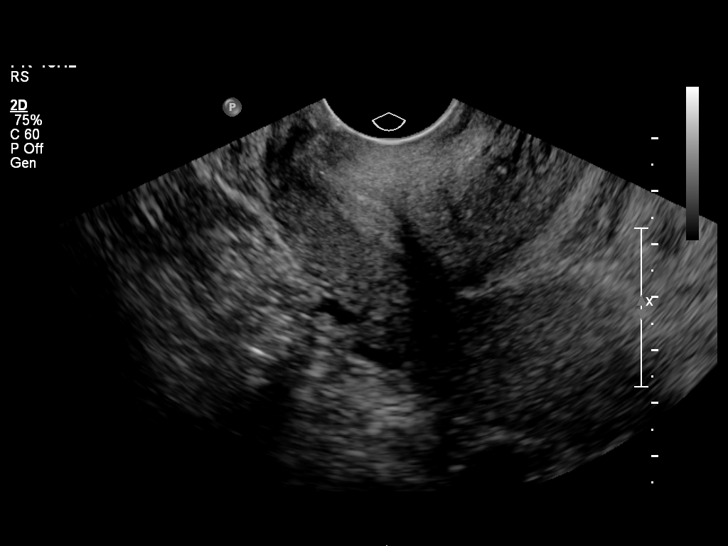
[im 17/30]
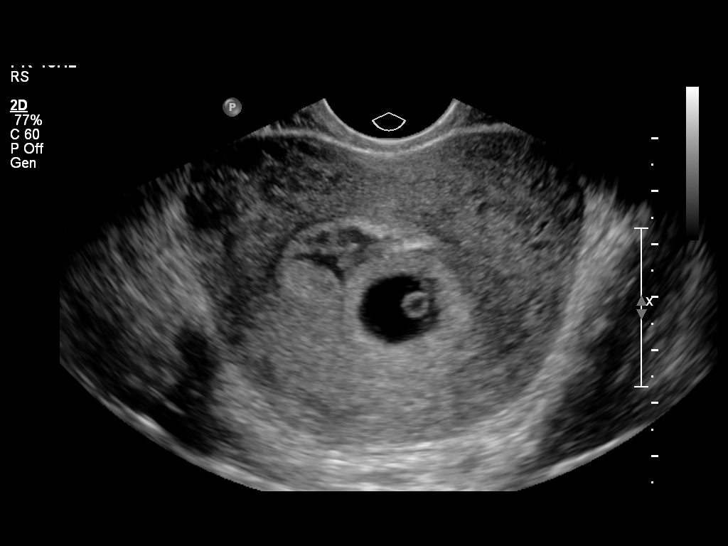
[im 20/30]
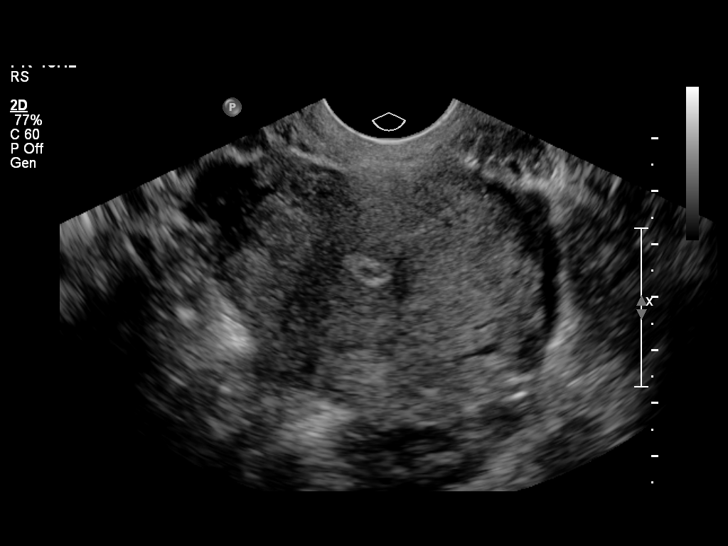
[im 23/30]
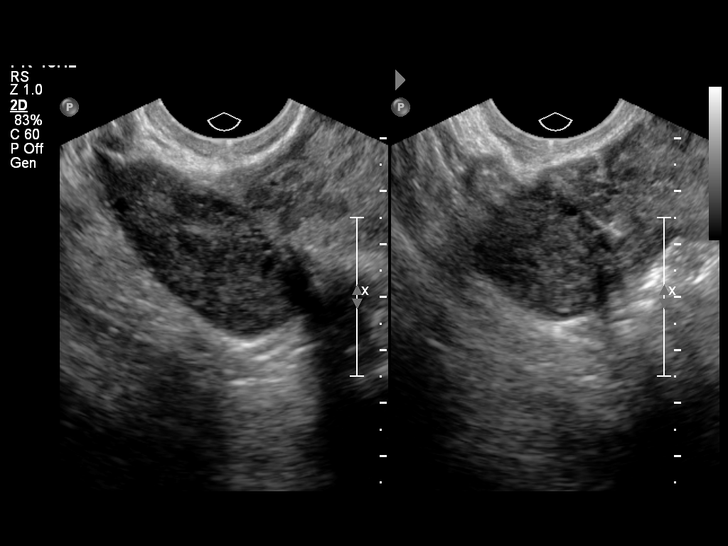
[im 26/30]
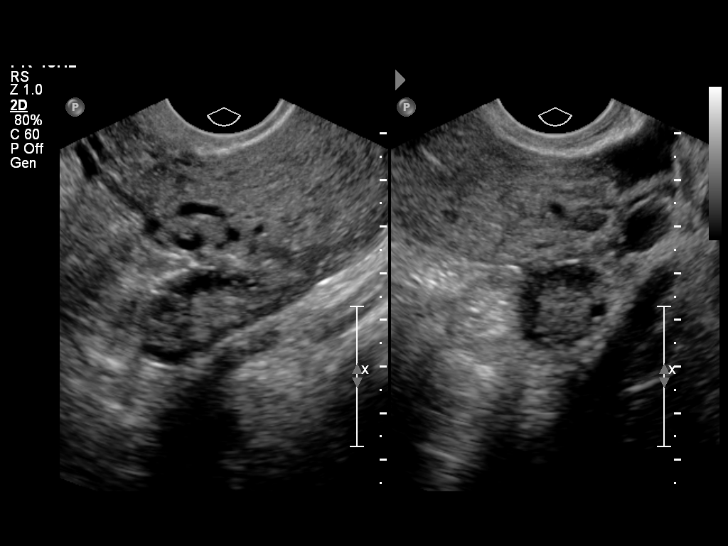
[im 30/30]
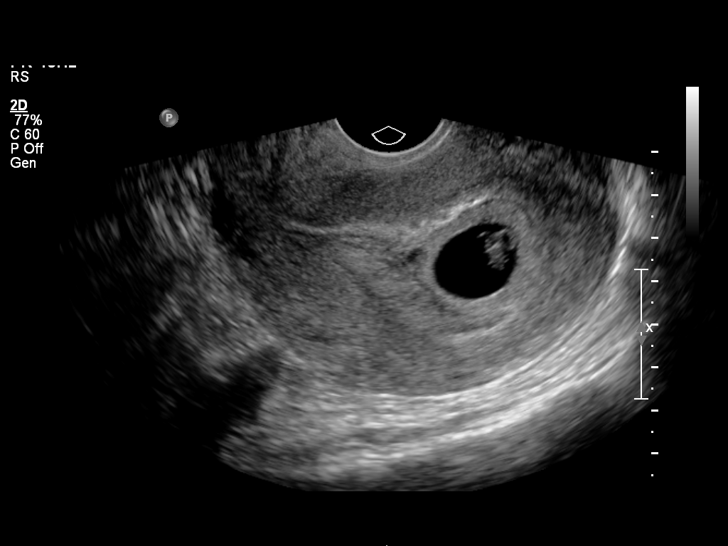

[14 of 28 positions shown; findings below may reference images not displayed]

FINDINGS: Intrauterine gestational sac: Single.

Yolk sac:  Visualized

Embryo:  Visualized

Cardiac Activity: Visualized

Heart Rate:  146 bpm

CRL:   9.9  mm   7 w 1 d                  US EDC: 07/11/2015

Maternal uterus/adnexae: Small subchorionic hemorrhage is evident.
Maternal ovaries are unremarkable. No free fluid in the cul-de-sac
although there is a trace amount identified in the left adnexal
space.
IMPRESSION: Single living intrauterine gestation at estimated 7 week 1 day
gestational age by crown-rump length.
